# Patient Record
Sex: Female | Born: 1940 | Race: White | Hispanic: No | Marital: Married | Smoking: Never smoker
Health system: Southern US, Community
[De-identification: ages and names within clinical notes are randomized; demographics above are authoritative.]

## PROBLEM LIST (undated history)

## (undated) DIAGNOSIS — I10 Essential (primary) hypertension: Secondary | ICD-10-CM

## (undated) DIAGNOSIS — E079 Disorder of thyroid, unspecified: Secondary | ICD-10-CM

## (undated) DIAGNOSIS — C801 Malignant (primary) neoplasm, unspecified: Secondary | ICD-10-CM

## (undated) DIAGNOSIS — J45909 Unspecified asthma, uncomplicated: Secondary | ICD-10-CM

## (undated) HISTORY — PX: CHOLECYSTECTOMY: SHX55

## (undated) HISTORY — PX: ABDOMINAL SURGERY: SHX537

## (undated) HISTORY — PX: ABDOMINAL HYSTERECTOMY: SHX81

---

## 2005-08-17 ENCOUNTER — Ambulatory Visit: Payer: Self-pay | Admitting: Internal Medicine

## 2007-04-17 ENCOUNTER — Ambulatory Visit: Payer: Self-pay | Admitting: Internal Medicine

## 2008-04-20 ENCOUNTER — Ambulatory Visit: Payer: Self-pay | Admitting: Internal Medicine

## 2009-08-04 ENCOUNTER — Ambulatory Visit: Payer: Self-pay | Admitting: Gastroenterology

## 2009-08-10 ENCOUNTER — Ambulatory Visit: Payer: Self-pay | Admitting: Gynecologic Oncology

## 2009-08-13 ENCOUNTER — Ambulatory Visit: Payer: Self-pay | Admitting: Unknown Physician Specialty

## 2009-09-07 ENCOUNTER — Ambulatory Visit: Payer: Self-pay | Admitting: Gynecologic Oncology

## 2010-03-29 ENCOUNTER — Ambulatory Visit: Payer: Self-pay | Admitting: Internal Medicine

## 2010-12-26 IMAGING — CT CT ABD-PELV W/ CM
1 of 2 series · 15 of 32 positions shown, 19 images · non-contrast
Comparison: none

REASON FOR EXAM: abdominal pain, LUQ RLQ chg bowel habits, dysphagia
vomiting
COMMENTS:

[Series 2: abdomen · axial · 0.67mm/px · z∈[+206,+576]mm · 15 of 82 slices shown, 19 images]
[im 4/82  soft-tissue]
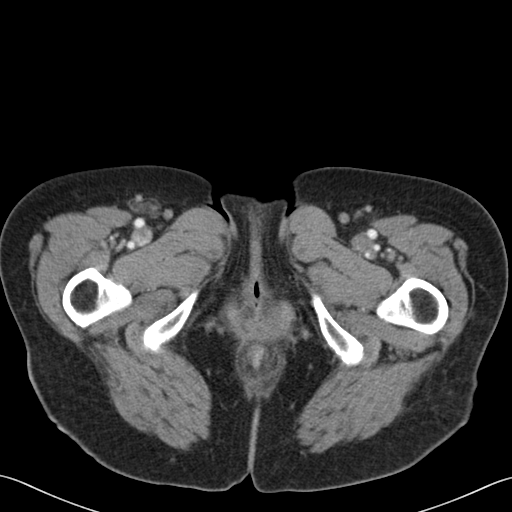
[im 4/82  bone]
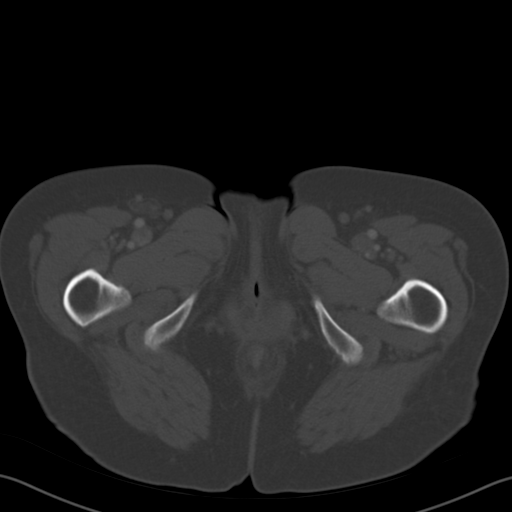
[im 10/82  soft-tissue]
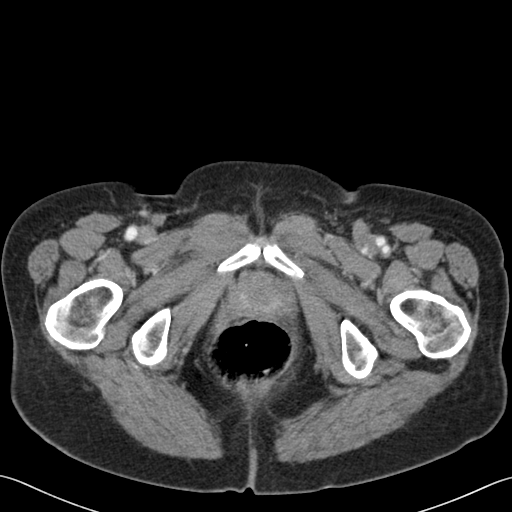
[im 17/82  soft-tissue]
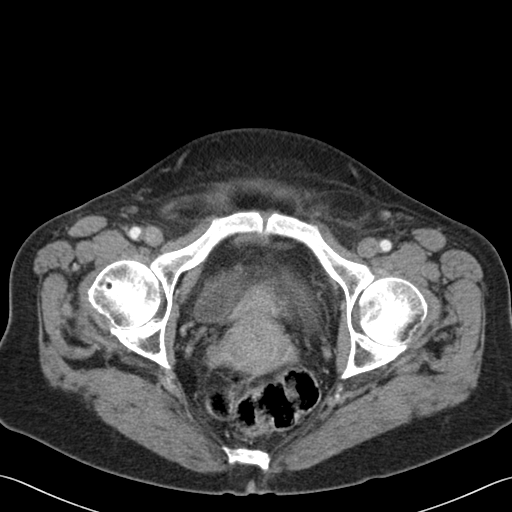
[im 23/82  soft-tissue]
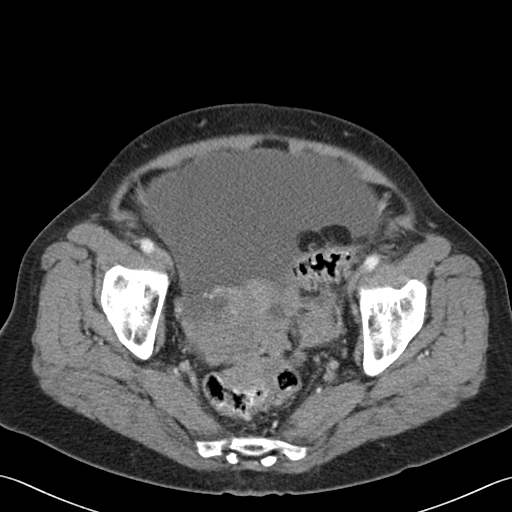
[im 30/82  soft-tissue]
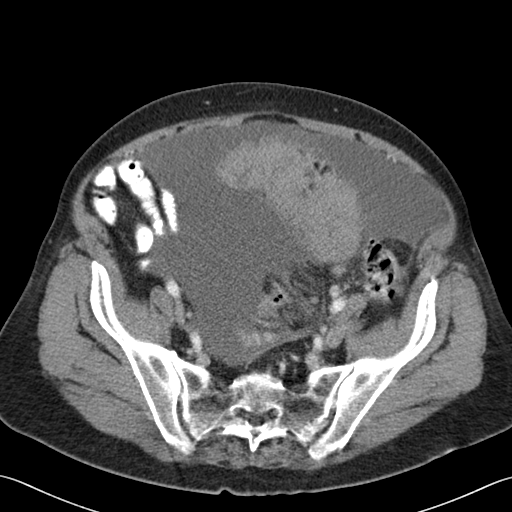
[im 36/82  soft-tissue]
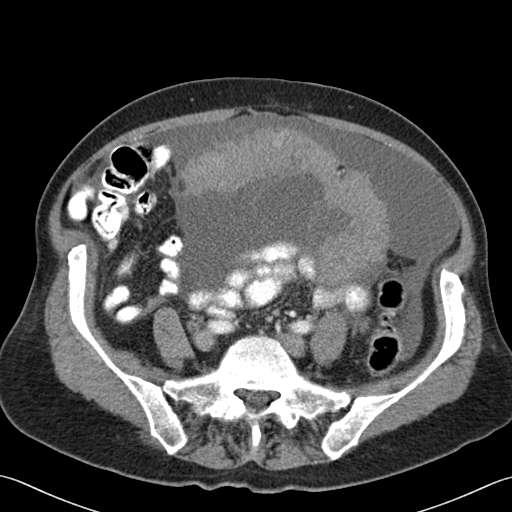
[im 43/82  soft-tissue]
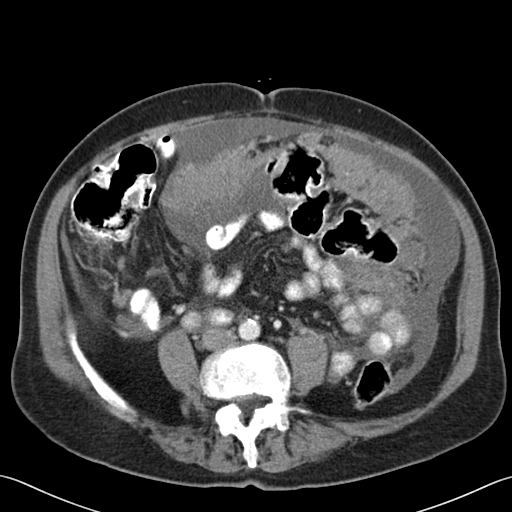
[im 46/82  soft-tissue]
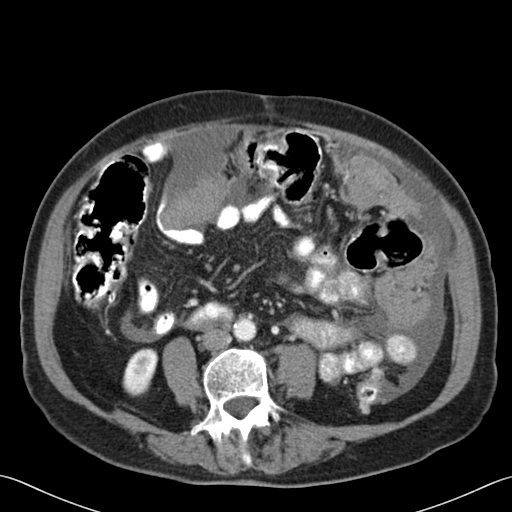
[im 52/82  soft-tissue]
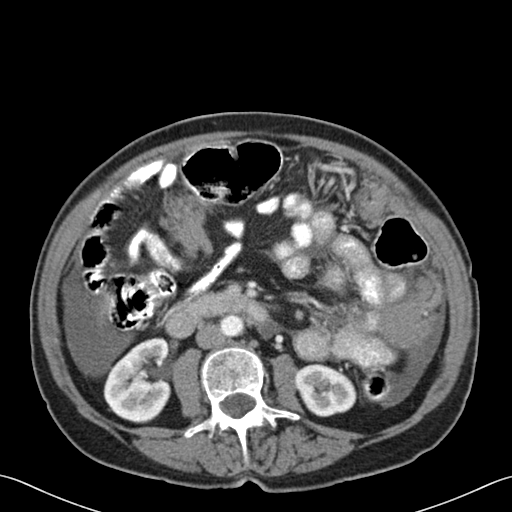
[im 52/82  bone]
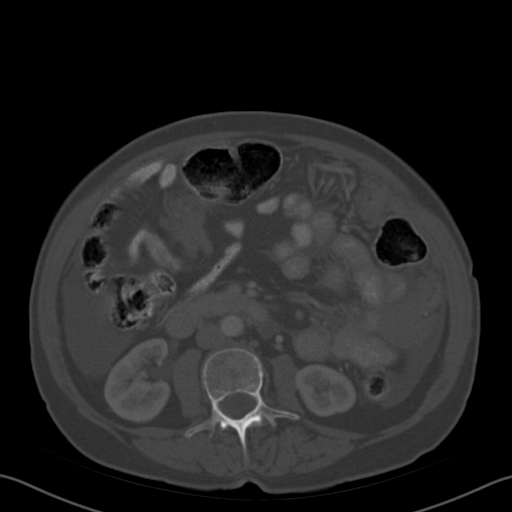
[im 59/82  soft-tissue]
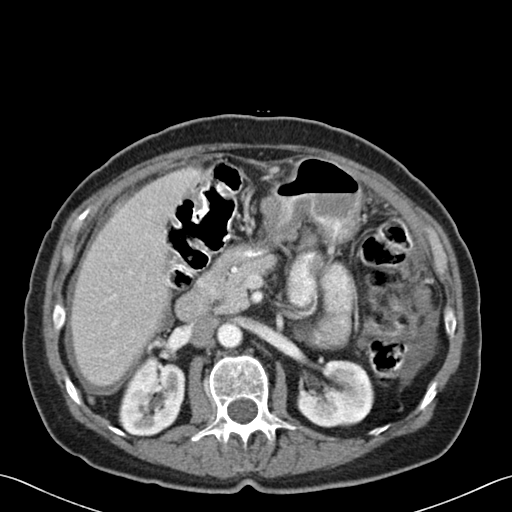
[im 65/82  soft-tissue]
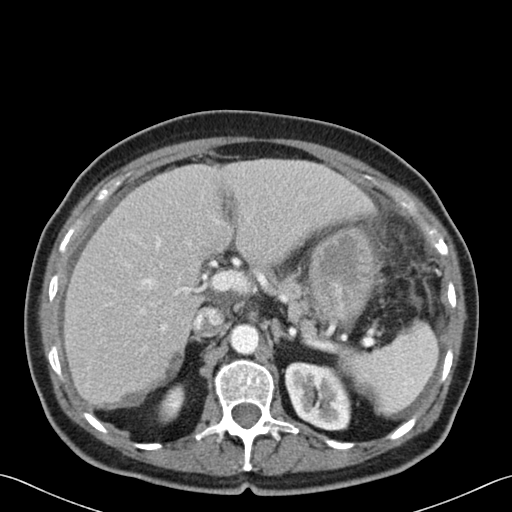
[im 69/82  lung]
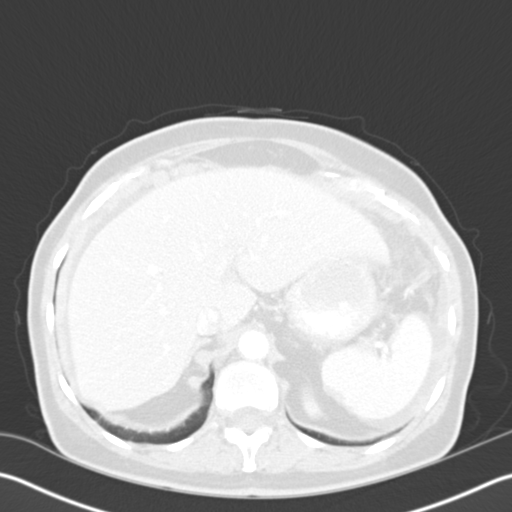
[im 72/82  soft-tissue]
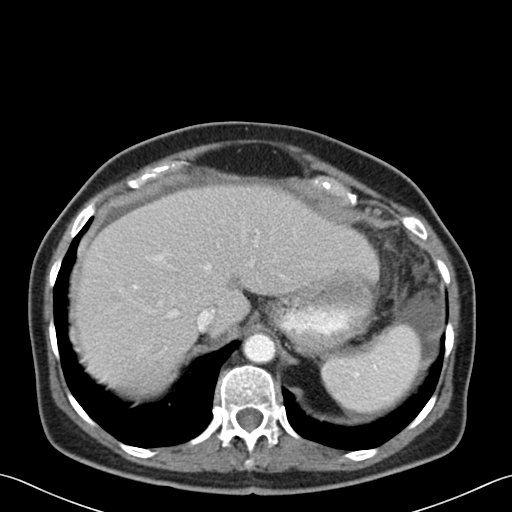
[im 72/82  lung]
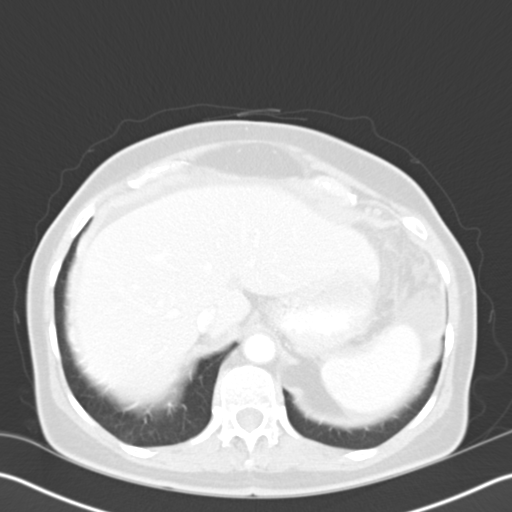
[im 75/82  lung]
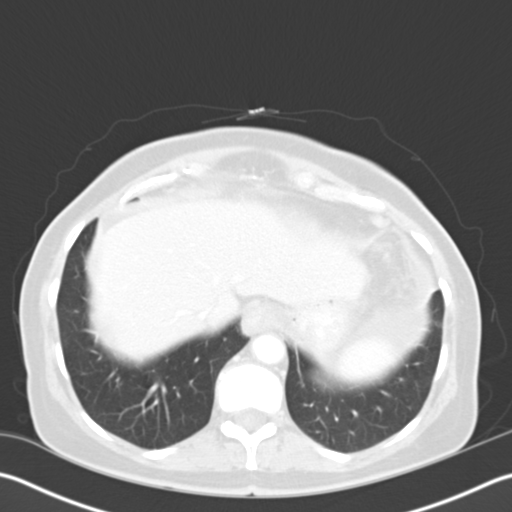
[im 78/82  soft-tissue]
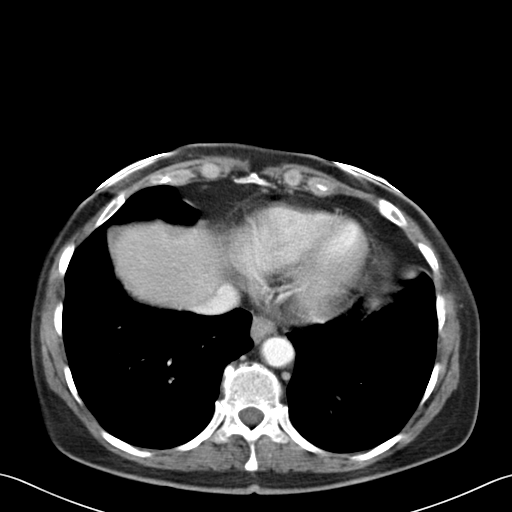
[im 78/82  lung]
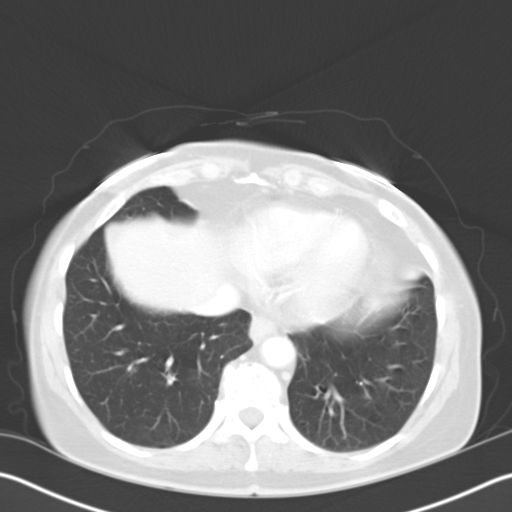

[15 of 32 positions shown; findings below may reference images not displayed]

PROCEDURE:     CT  - CT ABDOMEN / PELVIS  W  - August 04, 2009  [DATE]

RESULT:     CT of the abdomen and pelvis is performed utilizing 100 ml of
Esovue-P9U iodinated intravenous contrast along with oral contrast. Images
are reconstructed in the axial plane at 5 mm slice thickness. The patient
has no previous exam for comparison.

The study demonstrates extensive abnormality in the omentum and mesentery
with some heterogeneous nodularity in the left adnexal region measuring
x 3.90 cm. Another heterogeneous area is seen in the right adnexal region
measuring 5.26 x 5.34 cm on image #59. There is ascites present. There is no
pleural effusion. The lower portion of the lungs shows no focal pulmonary
mass. The liver and spleen enhance homogeneously. There is thickening of the
sigmoid colon which is nonspecific. This extends into the distal descending
colon. Correlate for possible colitis. There is no evidence of bowel
obstruction or abnormal bowel distention. Cholecystectomy clips are present.
The adrenal glands are normal. The pancreas appears unremarkable. No
retroperitoneal adenopathy is evident. The kidneys enhance symmetrically
without obstruction. No solid masses are present. There is some
low-attenuation along the posteromedial aspect of the right lobe of the
liver in the region of image #18 and image #19 which could represent
implants along the surface. Some areas of mesenteric nodularity are seen and
could represent enlarged lymph nodes or implants.

The urinary bladder appears nondistended.
IMPRESSION: 1.     Findings suspicious for underlying ovarian malignancy with extensive
omental caking and some mesenteric implants possibly with some hepatic
implants. There is an abnormal appearance of the distal descending and
sigmoid colon with wall thickening diffusely. Gynecologic Oncology
consultation is suggested.

## 2011-02-09 ENCOUNTER — Ambulatory Visit: Payer: Self-pay | Admitting: Rheumatology

## 2011-08-25 ENCOUNTER — Ambulatory Visit: Payer: Self-pay | Admitting: Internal Medicine

## 2011-12-18 ENCOUNTER — Ambulatory Visit: Payer: Self-pay | Admitting: General Practice

## 2011-12-18 LAB — URINALYSIS, COMPLETE
Bacteria: NONE SEEN
Ketone: NEGATIVE
Leukocyte Esterase: NEGATIVE
Nitrite: NEGATIVE
Ph: 7 (ref 4.5–8.0)
Protein: NEGATIVE
RBC,UR: NONE SEEN /HPF (ref 0–5)
Specific Gravity: 1.004 (ref 1.003–1.030)
WBC UR: 18 /HPF (ref 0–5)

## 2011-12-18 LAB — PROTIME-INR: INR: 0.9

## 2011-12-18 LAB — CBC
HGB: 13 g/dL (ref 12.0–16.0)
MCHC: 32.5 g/dL (ref 32.0–36.0)
MCV: 93 fL (ref 80–100)
RBC: 4.31 10*6/uL (ref 3.80–5.20)
RDW: 14.5 % (ref 11.5–14.5)

## 2011-12-18 LAB — BASIC METABOLIC PANEL
BUN: 12 mg/dL (ref 7–18)
Calcium, Total: 10.2 mg/dL — ABNORMAL HIGH (ref 8.5–10.1)
Chloride: 105 mmol/L (ref 98–107)
Co2: 27 mmol/L (ref 21–32)
Creatinine: 0.62 mg/dL (ref 0.60–1.30)
EGFR (African American): >60
EGFR (Non-African Amer.): >60
Glucose: 96 mg/dL (ref 65–99)
Potassium: 4 mmol/L (ref 3.5–5.1)
Sodium: 140 mmol/L (ref 136–145)

## 2011-12-18 LAB — MRSA PCR SCREENING

## 2011-12-18 LAB — SEDIMENTATION RATE: Erythrocyte Sed Rate: 17 mm/hr (ref 0–30)

## 2011-12-19 LAB — URINE CULTURE

## 2012-01-03 ENCOUNTER — Inpatient Hospital Stay: Payer: Self-pay | Admitting: General Practice

## 2012-01-04 LAB — BASIC METABOLIC PANEL
Anion Gap: 8 (ref 7–16)
BUN: 11 mg/dL (ref 7–18)
Calcium, Total: 9.4 mg/dL (ref 8.5–10.1)
EGFR (African American): >60
EGFR (Non-African Amer.): >60

## 2012-01-04 LAB — HEMOGLOBIN: HGB: 11.1 g/dL — ABNORMAL LOW (ref 12.0–16.0)

## 2012-01-05 LAB — BASIC METABOLIC PANEL
Anion Gap: 5 — ABNORMAL LOW (ref 7–16)
Chloride: 108 mmol/L — ABNORMAL HIGH (ref 98–107)
EGFR (Non-African Amer.): >60
Glucose: 96 mg/dL (ref 65–99)
Osmolality: 281 (ref 275–301)
Potassium: 4.1 mmol/L (ref 3.5–5.1)

## 2012-01-05 LAB — HEMOGLOBIN: HGB: 9.9 g/dL — ABNORMAL LOW (ref 12.0–16.0)

## 2012-02-08 DIAGNOSIS — C569 Malignant neoplasm of unspecified ovary: Secondary | ICD-10-CM | POA: Insufficient documentation

## 2012-04-09 ENCOUNTER — Ambulatory Visit: Payer: Self-pay | Admitting: General Practice

## 2012-04-09 LAB — BASIC METABOLIC PANEL
Anion Gap: 5 — ABNORMAL LOW (ref 7–16)
Calcium, Total: 10.4 mg/dL — ABNORMAL HIGH (ref 8.5–10.1)
Chloride: 103 mmol/L (ref 98–107)
EGFR (Non-African Amer.): >60
Glucose: 98 mg/dL (ref 65–99)
Osmolality: 277 (ref 275–301)
Potassium: 4 mmol/L (ref 3.5–5.1)
Sodium: 139 mmol/L (ref 136–145)

## 2012-04-09 LAB — URINALYSIS, COMPLETE
Bilirubin,UR: NEGATIVE
Blood: NEGATIVE
Ketone: NEGATIVE
Leukocyte Esterase: NEGATIVE
Nitrite: NEGATIVE
Ph: 7 (ref 4.5–8.0)
Protein: NEGATIVE
RBC,UR: <1 /HPF (ref 0–5)
Squamous Epithelial: NONE SEEN
Transitional Epi: <1

## 2012-04-09 LAB — SEDIMENTATION RATE: Erythrocyte Sed Rate: 5 mm/hr (ref 0–30)

## 2012-04-09 LAB — CBC
HCT: 40.7 % (ref 35.0–47.0)
HGB: 13.7 g/dL (ref 12.0–16.0)
MCHC: 33.6 g/dL (ref 32.0–36.0)
Platelet: 396 10*3/uL (ref 150–440)
RBC: 4.5 10*6/uL (ref 3.80–5.20)
RDW: 14.1 % (ref 11.5–14.5)
WBC: 9.8 10*3/uL (ref 3.6–11.0)

## 2012-04-09 LAB — APTT: Activated PTT: 29.2 secs (ref 23.6–35.9)

## 2012-04-22 ENCOUNTER — Inpatient Hospital Stay: Payer: Self-pay | Admitting: General Practice

## 2012-04-23 LAB — BASIC METABOLIC PANEL
Anion Gap: 8 (ref 7–16)
BUN: 10 mg/dL (ref 7–18)
Calcium, Total: 8.9 mg/dL (ref 8.5–10.1)
Chloride: 107 mmol/L (ref 98–107)
Creatinine: 0.85 mg/dL (ref 0.60–1.30)
EGFR (African American): >60

## 2012-04-23 LAB — PLATELET COUNT: Platelet: 306 10*3/uL (ref 150–440)

## 2012-04-24 LAB — BASIC METABOLIC PANEL
Anion Gap: 6 — ABNORMAL LOW (ref 7–16)
BUN: 8 mg/dL (ref 7–18)
Calcium, Total: 9 mg/dL (ref 8.5–10.1)
Chloride: 106 mmol/L (ref 98–107)
Co2: 27 mmol/L (ref 21–32)
EGFR (Non-African Amer.): >60
Glucose: 95 mg/dL (ref 65–99)
Potassium: 3.8 mmol/L (ref 3.5–5.1)
Sodium: 139 mmol/L (ref 136–145)

## 2012-04-24 LAB — HEMOGLOBIN: HGB: 11.1 g/dL — ABNORMAL LOW (ref 12.0–16.0)

## 2012-05-16 DIAGNOSIS — Z9889 Other specified postprocedural states: Secondary | ICD-10-CM | POA: Insufficient documentation

## 2012-05-16 DIAGNOSIS — E039 Hypothyroidism, unspecified: Secondary | ICD-10-CM | POA: Insufficient documentation

## 2012-05-16 DIAGNOSIS — I10 Essential (primary) hypertension: Secondary | ICD-10-CM | POA: Insufficient documentation

## 2012-07-02 IMAGING — CR DG KNEE STANDING AP BILAT
1 series · 1 of 1 positions shown · non-contrast
Comparison: none

REASON FOR EXAM: pain
COMMENTS:

[view not recorded]
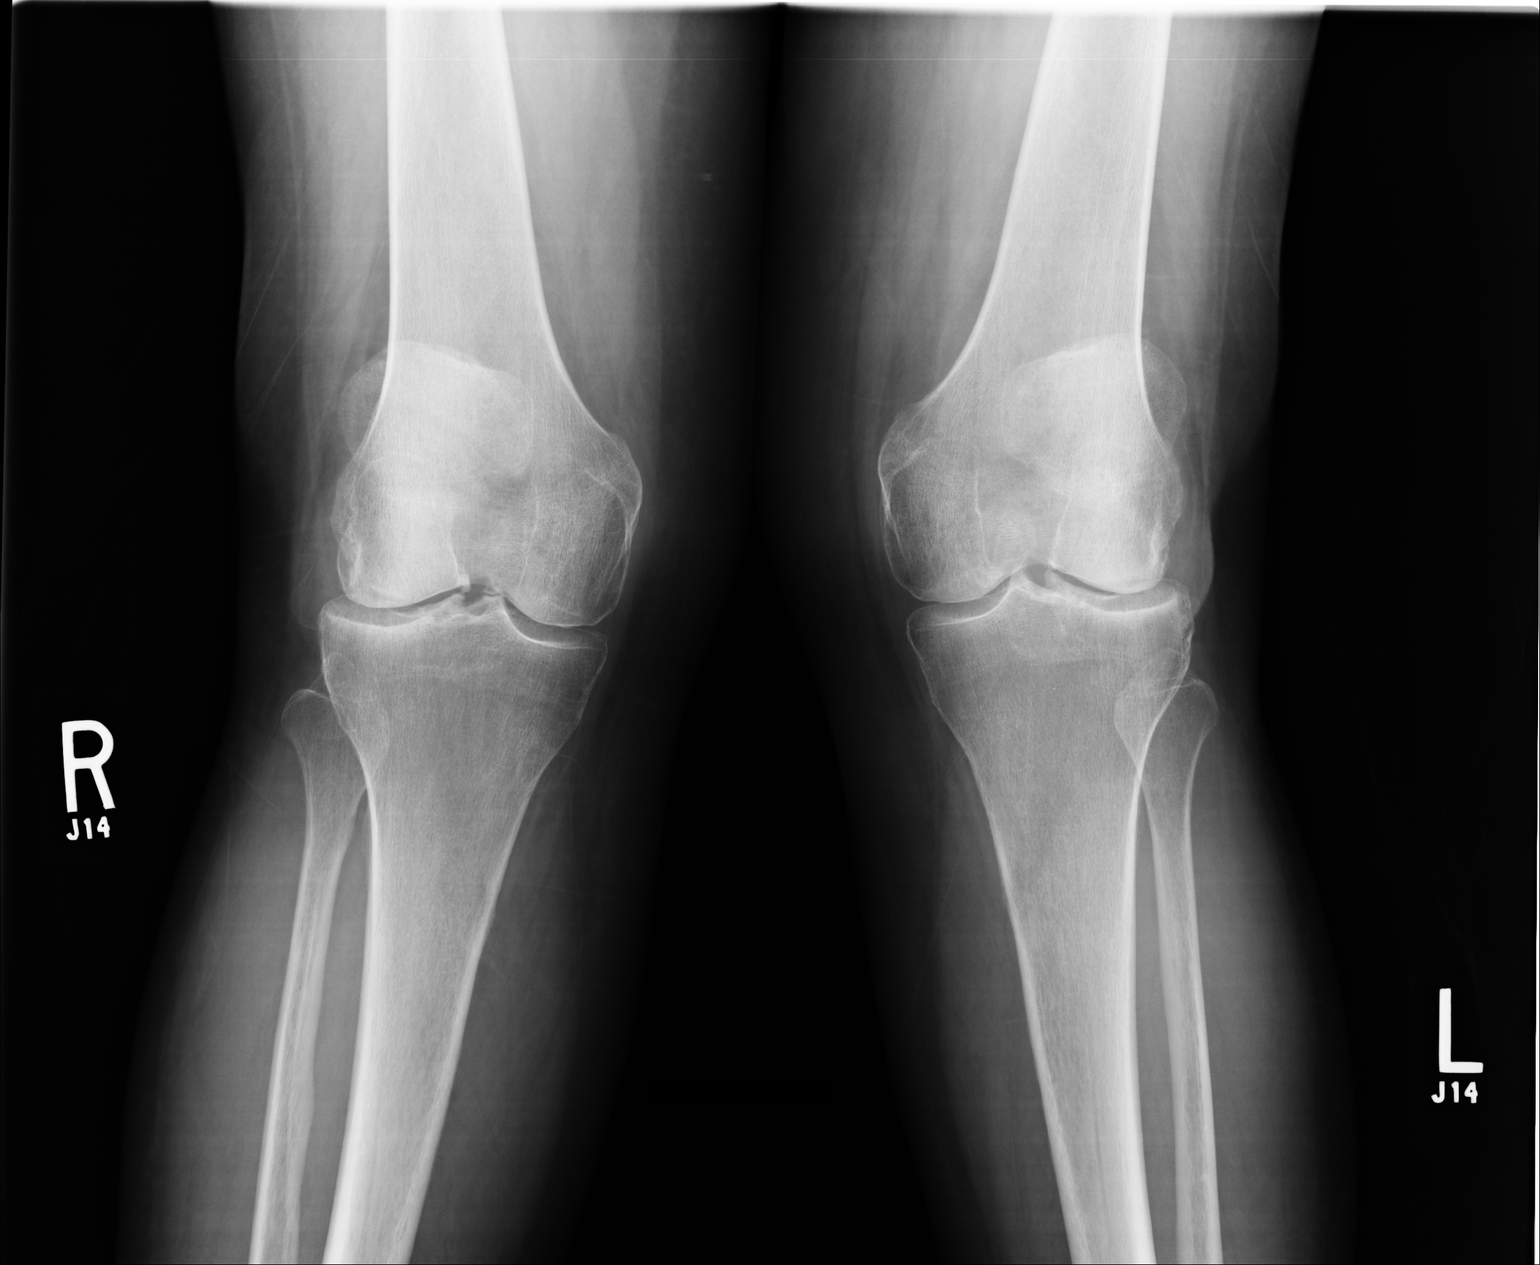

[1 of 1 positions shown; findings below may reference images not displayed]

PROCEDURE:     DXR - DXR BILATERAL KNEES STANDING  - February 09, 2011  [DATE]

RESULT:     Standing AP views of both knees were performed. No fracture or
dislocation is seen. No definite spur formation about the knee is seen on
either side. The knee joint spaces are symmetrical. There are noted clefts
in both tibial spines on the right. The possibility of cruciate ligament
injuries cannot be excluded. The tibial spines on the left are normal in
appearance.
IMPRESSION: There are noted clefts in both tibial spines on the right.
The possibility of cruciate ligament injuries cannot be excluded. This can
be further evaluated with MR, if clinically indicated.

## 2012-08-26 ENCOUNTER — Ambulatory Visit: Payer: Self-pay | Admitting: Internal Medicine

## 2012-09-19 DIAGNOSIS — R519 Headache, unspecified: Secondary | ICD-10-CM | POA: Insufficient documentation

## 2013-10-30 ENCOUNTER — Ambulatory Visit: Payer: Self-pay | Admitting: Internal Medicine

## 2014-05-20 ENCOUNTER — Ambulatory Visit: Payer: Self-pay | Admitting: Podiatry

## 2014-05-25 ENCOUNTER — Encounter: Payer: Self-pay | Admitting: Podiatry

## 2014-05-25 ENCOUNTER — Ambulatory Visit (INDEPENDENT_AMBULATORY_CARE_PROVIDER_SITE_OTHER): Payer: Medicare HMO | Admitting: Podiatry

## 2014-05-25 VITALS — BP 155/75 | HR 76 | Resp 16 | Ht 63.0 in | Wt 158.0 lb

## 2014-05-25 DIAGNOSIS — M79606 Pain in leg, unspecified: Secondary | ICD-10-CM

## 2014-05-25 DIAGNOSIS — B351 Tinea unguium: Secondary | ICD-10-CM

## 2014-05-25 NOTE — Progress Notes (Signed)
   Subjective:    Patient ID: Kendra Park, female    DOB: 12-09-1940, 73 y.o.   MRN: [id number]  HPI Comments: I need my toenails trimmed. The only time they hurt is when they grow out. My 2nd toenail on my rt foot is thick. This has been going on for 1 year. Its getting worse. i have pedicures.      Review of Systems  All other systems reviewed and are negative.      Objective:   Physical Exam: I have reviewed her past medical history medications allergies surgery social history and review of systems. Pulses are strongly palpable bilateral.neurologic sensorium is intact per Sims was C monofilament. Deep tendon reflexes intact bilateral muscle strength is 5 over 5 dorsiflexion plantar flexors and inverters everters onto the musculatures intact. Orthopedic evaluation to the straight all joints distal to the ankle for range of motion without crepitation. She has severe hammertoe deformitiesHAVdeformities bilateral. Cutaneous evaluation demonstrates yellow dystrophic with mycotic nails which are painful palpation as well as debridement bilateral.        Assessment & Plan:  Assessment: Severe digital deformities bilateral. Pain in limb secondary to onychomycosis 1 through 5 bilateral.  Plan: Debridement of nails 1 through 5 bilateral covered service a new pain.

## 2014-07-29 ENCOUNTER — Ambulatory Visit (INDEPENDENT_AMBULATORY_CARE_PROVIDER_SITE_OTHER): Payer: PPO | Admitting: Podiatry

## 2014-07-29 DIAGNOSIS — M79606 Pain in leg, unspecified: Secondary | ICD-10-CM

## 2014-07-29 DIAGNOSIS — B351 Tinea unguium: Secondary | ICD-10-CM

## 2014-07-29 NOTE — Progress Notes (Signed)
She presents today with a chief complaint of painful elongated toenails.  Objective: Her nails are thick yellow dystrophic with mycotic and painful palpation.  Assessment: Pain limb secondary onychomycosis 1 through 5 bilateral.  Plan: Treatment of nails 1 through 5 bilateral.

## 2014-09-28 ENCOUNTER — Ambulatory Visit (INDEPENDENT_AMBULATORY_CARE_PROVIDER_SITE_OTHER): Payer: PPO | Admitting: Podiatry

## 2014-09-28 DIAGNOSIS — B351 Tinea unguium: Secondary | ICD-10-CM | POA: Diagnosis not present

## 2014-09-28 DIAGNOSIS — M79606 Pain in leg, unspecified: Secondary | ICD-10-CM

## 2014-09-28 NOTE — Progress Notes (Signed)
She presents today with a chief complaint of painful elongated toenails.  Objective: Her nails are thick yellow dystrophic with mycotic and painful palpation.  Assessment: Pain limb secondary onychomycosis 1 through 5 bilateral.  Plan: Treatment of nails 1 through 5 bilateral.

## 2014-10-27 NOTE — Discharge Summary (Signed)
PATIENT NAME:  Kendra Park, MARTORANO MR#:  [id number] DATE OF BIRTH:  Nov 12, 1940  DATE OF ADMISSION:  04/22/2012 DATE OF DISCHARGE:  04/25/2012  ADMITTING DIAGNOSIS: Degenerative arthrosis of the left knee.   DISCHARGE DIAGNOSIS: Degenerative arthrosis of the left knee.   HISTORY: The patient is a 74 year old who has been followed at [facility] for bilateral knee pain. She reported a several year history of knee pain. Initially the right knee was noted to be more symptomatic. Subsequently, she underwent a right total knee arthroplasty approximately three months ago and did very well and was pleased with the surgery. She continued to have discomfort on the left and subsequently decided to have the left knee done as well. She complained of pain along both the medial and lateral aspects of the knee. She did report some occasional near giving way. The patient had not seen any significant improvement in her symptoms despite activity modification, intraarticular cortisone injections as well as a series of Synvisc injections to the left knee. The patient states that the pain had progressed to the point that it was significantly interfering with her activities of daily living. X-rays taken in the clinic showed narrowing of the lateral compartment with increased valgus deformity. There was mild subchondral sclerosis as well as some very early osteophyte formation noted. After discussion of the risks and benefits of surgical intervention, the patient expressed her understanding of the risks and benefits and agreed with plans for surgical intervention.   PROCEDURE: Left total knee arthroplasty using computer-assisted navigation.   ANESTHESIA: Femoral nerve block with spinal.   SOFT TISSUE RELEASES: Anterior cruciate ligament, posterior cruciate ligament, deep medial collateral ligament, patellofemoral ligament, posterolateral corner, and pie-crusting of the iliotibial band.   IMPLANTS UTILIZED: DePuy PFC Sigma  size three posterior stabilized femoral component (cemented), size 2.5 MBT tibial component (cemented), 35-mm three pegged oval dome patella (cemented), and a 10-mm stabilized rotating platform polyethylene insert.   HOSPITAL COURSE: The patient tolerated the procedure very well. She had no complications. She was then taken to the PAC-U where she was stabilized and then transferred to the orthopedic floor. The patient began receiving anticoagulation therapy of Lovenox 30 mg subcutaneous every 12 hours per anesthesia and pharmacy protocol. She was fitted with TED stockings bilaterally. These were allowed to be removed one hour per eight-hour shift. The left one was applied on day two following removal of the Hemovac and dressing change. The patient was also fitted with the AV-I compression foot pumps bilaterally set at 80 mmHg. Her calves have been nontender. There has been no evidence of any deep venous thromboses. Heels were elevated off the bed using rolled towels.   The patient has denied any chest pain or any shortness of breath. Vital signs have been stable. She has been afebrile. Hemodynamically she was stable. No transfusions were given other than the Autovac transfusion given postoperatively. Laboratory studies were  in acceptable limits. .   Physical therapy was initiated on day one for activities of daily living and assistive devices. Upon being discharged she was ambulating greater than 200 feet. She was able to go up and down four sets of steps. She was independent with bed to chair transfers. Occupational therapy was also initiated on day one for activities of daily living and assistive devices.   The patient's IV, Foley, and Hemovac were discontinued on day two along with a dressing change. The wound was free of any drainage or signs of infection.  The Polar Care was  reapplied to the surgical leg maintaining a temperature of 40 to 50 degrees Fahrenheit.   DISPOSITION: The patient is being  discharged to home in improved stable condition.   DISCHARGE INSTRUCTIONS:  1. She may weight bear as tolerated.  2. Continue using the TED stockings during the day but may remove these at night.  3. She is to continue using a walker until cleared by physical therapy to go to a quad cane.  4. She will receive Home Health physical therapy.   5. Continue to use the Polar Care maintaining a temperature of 40 to 50 degrees Fahrenheit.  6. She is placed on a regular diet.  7. She is to follow up in the clinic in two weeks, sooner if any temperatures of 101.5 or greater or excessive bleeding.  8. She is to resume her regular medication that she was on prior to admission. She was given a prescription for Lovenox 40 mg subcutaneously daily for 14 days, then discontinue and begin taking one 81-mg enteric-coated aspirin. Also a prescription for oxycodone 1 to 2 tablets every 4 to 6 hours p.r.n. for pain. Tramadol 50 to 100 mg q. 4-6 hours p.r.n. for pain.   PAST MEDICAL HISTORY: 1. Seasonal allergies.  2. Hypothyroidism.  3. Ovarian cancer.  4. Hypertension.  5. Osteoarthritis.  6. Osteoporosis.    ____________________________ Vance Peper, PA [email or web address] D: 04/25/2012 07:42:42 ET T: 04/25/2012 14:51:51 ET JOB#: [id number]  cc: Vance Peper, PA, <Dictator> JON WOLFE PA ELECTRONICALLY SIGNED 04/28/2012 21:36

## 2014-10-27 NOTE — Op Note (Signed)
PATIENT NAME:  Kendra Park, Kendra Park MR#:  [id number] DATE OF BIRTH:  1940/10/19  DATE OF PROCEDURE:  04/22/2012  PREOPERATIVE DIAGNOSIS: Degenerative arthrosis of the left knee.   POSTOPERATIVE DIAGNOSIS: Degenerative arthrosis of the left knee.   PROCEDURE PERFORMED: Left total knee arthroplasty using computer-assisted navigation.   SURGEON: Laurice Record. Holley Bouche., MD    ASSISTANT: Vance Peper, PA-C (required to maintain retraction throughout the procedure)   ANESTHESIA: Femoral nerve block and spinal.   ESTIMATED BLOOD LOSS: 50 mL.   FLUIDS REPLACED: 1700 mL of Crystalloid.   TOURNIQUET TIME: 97 minutes.   DRAINS: Two medium drains to reinfusion system.   SOFT TISSUE RELEASES: Anterior cruciate ligament, posterior cruciate ligament, deep medial collateral ligament, patellofemoral ligament, posterolateral corner, and pie-crusting of the iliotibial band.   IMPLANTS UTILIZED: DePuy PFC Sigma size 3 posterior stabilized femoral component (cemented), size 2.5 MBT tibial component (cemented), 35 mm three peg oval dome patella (cemented), and a 10 mm stabilized rotating platform polyethylene insert.   INDICATIONS FOR SURGERY: The patient is a 74 year old female who has been seen for complaints of progressive left knee pain and valgus deformity. X-rays of the left knee demonstrated severe degenerative changes with valgus deformity. She had previously undergone successful right total knee arthroplasty with marked improvement of her right knee symptoms. Risks and benefits of surgical intervention were discussed with the patient. She expressed her understanding of the risks and benefits and agreed with plans for surgical intervention.   PROCEDURE IN DETAIL: The patient was brought in the operating room and, after adequate femoral nerve block and spinal anesthesia was achieved, a tourniquet was placed on the patient's upper left thigh. The patient's left knee and leg were cleaned and prepped with alcohol  and DuraPrep and draped in the usual sterile fashion. A "time-out" was performed as per usual protocol. The left lower extremity was exsanguinated using an Esmarch and the tourniquet was inflated to 300 mmHg. Anterior longitudinal incision was made followed by a standard mid vastus approach. A moderate effusion was evacuated. The deep fibers of the medial collateral ligament were elevated in a subperiosteal fashion off the medial flare of the tibia so as to maintain a continuous soft tissue sleeve. Patella was subluxed laterally and patellofemoral ligament was incised. Inspection of the knee demonstrated severe degenerative changes most notably to the lateral compartment. There was evidence of full thickness loss of articular cartilage to the lateral compartment. Prominent osteophytes were debrided using a rongeur. Anterior and posterior cruciate ligaments were excised. Two 4.0 mm Schanz pins were inserted in the femur and in the tibia for attachment of the array of spheres used for computer-assisted navigation. Hip center was identified using circumduction technique. Distal landmarks were mapped using the computer. Distal femur and proximal tibia were mapped using the computer. Distal femoral cutting guide was positioned using computer-assisted navigation so as to achieve a 5 degree distal valgus cut. Cut was performed and verified using the computer. Distal femur was sized and it was felt that a size 3 femoral component was appropriate. Size 3 cutting guide was positioned and the anterior cut was performed and verified using the computer. This was followed by completion of the posterior and chamfer cuts. Femoral cutting guide for the central box was then positioned and the central box cut was performed.   Attention was then directed to the proximal tibia. Medial and lateral menisci were excised. The extramedullary tibial cutting guide was positioned using computer-assisted navigation so as to achieve 0  degree  varus valgus alignment and 0 degree posterior slope. Cut was performed and verified using the computer. Proximal tibia was sized and it was felt that a size 2.5 tibial tray was appropriate. Tibial and femoral trials were inserted followed by insertion of a 10 mm polyethylene insert. Knee was felt to be tight laterally. Trial components were removed. The knee was placed in extension and then Moreland retractors were used to distract the joint. The posterolateral corner was noted be extremely tight. Using a combination of electrocautery and Metzenbaum scissors, the posterolateral corner was carefully released. This allowed for improvement with mediolateral soft tissue balancing. Trial components were reinserted followed by insertion of a 10 mm polyethylene insert. The IT band was still felt to be extremely tight and pie-crusting of the iliotibial band was performed. This allowed for excellent mediolateral soft tissue balancing both in full extension and in flexion. Finally, patella was cut and prepared so as to accommodate a 35 mm three peg oval dome patella. Patellar trial was placed and the knee was placed through a range of motion with excellent patellar tracking appreciated.   Polyethylene was removed. Posterior osteophytes were debrided from the femur using a combination of osteotomes and rongeurs. Femoral component was removed. Drill holes were made in areas of eburnated bone along the lateral femoral condyle. Central post hole for the tibial component was reamed followed by insertion of a keel punch. Tibial tray was then removed. Cut surfaces of bone were irrigated with copious amounts of normal saline with antibiotic solution using pulsatile lavage and then suctioned dry. Polymethyl methacrylate cement with gentamicin was prepared in the usual fashion using a vacuum mixer. Cement was applied to the cut surface of the tibia as well as along the undersurface of a size 2.5 MBT tibial component. Tibial component  was positioned and impacted into place. Excess cement was removed using freer elevators. Cement was then applied to the cut surface of the distal femur as well as along the posterior flanges of size 3 posterior stabilized femoral component. Femoral component was positioned and impacted into place. Excess cement was removed using freer elevators. A 10 mm polyethylene trial was inserted and the knee was brought in full extension with steady axial compression applied. Finally, cement was applied to the backside of a 35 mm three peg oval dome patella and patellar component was positioned and patellar clamp applied. Excess cement was removed using freer elevators.   After adequate curing of cement, the tourniquet was deflated after a total tourniquet time of 97 minutes. Hemostasis was achieved using electrocautery. The knee was irrigated with copious amounts of normal saline with antibiotic solution using pulsatile lavage and then suctioned dry. The knee was inspected for any residual cement debris. 30 mL of 0.25% Marcaine with epinephrine was injected along the posterior capsule. A 10 mm stabilized rotating platform polyethylene insert was inserted and the knee was placed through a range of motion. Excellent patellar tracking was appreciated. Excellent mediolateral soft tissue balancing was appreciated both in full extension and in flexion. Two medium drains were placed in the wound bed and brought out through a separate stab incision to be attached to a reinfusion system. The medial parapatellar portion of the incision was reapproximated using interrupted sutures of #1 Vicryl. Subcutaneous tissue was approximated in layers using first #0 Vicryl followed by #2-0 Vicryl. Skin was closed with skin staples. Sterile dressing was applied.   The patient tolerated the procedure well. She was transported to the recovery room  in stable condition.    ____________________________ Laurice Record. Holley Bouche.,  MD [id number] D: 04/22/2012 19:22:53 ET T: 04/23/2012 07:52:04 ET JOB#: [id number] cc: Laurice Record. Holley Bouche., MD, <Dictator> JAMES P Holley Bouche MD ELECTRONICALLY SIGNED 04/26/2012 7:00

## 2014-11-01 NOTE — Op Note (Signed)
PATIENT NAME:  Kendra Park, Kendra Park MR#:  [id number] DATE OF BIRTH:  [birth year]  DATE OF PROCEDURE:  01/03/2012  PREOPERATIVE DIAGNOSIS: Degenerative arthrosis of the right knee.   POSTOPERATIVE DIAGNOSIS: Degenerative arthrosis of the right knee.   PROCEDURE PERFORMED: Right total knee arthroplasty using computer-assisted navigation.   SURGEON: Laurice Record. Holley Bouche., MD    ASSISTANT: Vance Peper, PA-C (required to maintain retraction throughout the procedure)   ANESTHESIA: Femoral nerve block and spinal.   ESTIMATED BLOOD LOSS: 50 mL.   FLUIDS REPLACED: 1400 mL of Crystalloid.   TOURNIQUET TIME: 91 minutes.   DRAINS: Two medium drains to reinfusion system.   SOFT TISSUE RELEASES: Anterior cruciate ligament, posterior cruciate ligament, deep medial collateral ligament, patellofemoral ligament, and posterolateral corner.   IMPLANTS UTILIZED: DePuy PFC Sigma size 3 posterior stabilized femoral component (cemented), size 2.5 MBT tibial component (cemented), 35 mm three peg oval dome patella (cemented), and a 10 mm stabilized rotating platform polyethylene insert.   INDICATIONS FOR SURGERY: The patient is a 74 year old female who has been seen for complaints of progressive right knee pain. X-rays demonstrated severe degenerative changes in tricompartmental fashion with significant valgus deformity. After discussion of the risks and benefits of surgical intervention, the patient expressed her understanding of the risks and benefits and agreed with plans for surgical intervention.   PROCEDURE IN DETAIL: The patient was brought in the operating room and, after adequate femoral nerve block and spinal anesthesia was achieved, a tourniquet was placed on the patient's upper right thigh. The patient's right knee and leg were cleaned and prepped with alcohol and DuraPrep and draped in the usual sterile fashion. A "time-out" was performed as per usual protocol. The right lower extremity was exsanguinated  using an Esmarch, and the tourniquet was inflated to 300 mmHg. Anterior longitudinal incision was made followed by a standard mid vastus approach. A large effusion was evacuated. The deep fibers of the medial collateral ligament were elevated in a subperiosteal fashion off the medial flare of the tibia so as to maintain a continuous soft tissue sleeve. The patella was subluxed laterally and the patellofemoral ligament was incised. Inspection of the knee demonstrated severe degenerative changes in tricompartmental fashion with evidence of eburnated bone to the lateral compartment. Prominent osteophytes were debrided using a rongeur. Anterior and posterior cruciate ligaments were excised. Two 4.0 mm Schanz pins were inserted into the femur and into the tibia for attachment of the array of spheres used for computer-assisted navigation. Hip center was identified using circumduction technique. Distal landmarks were mapped using the computer. Distal femur and proximal tibia were mapped using the computer. Distal femoral cutting guide was positioned using computer-assisted navigation so as to achieve a 5 degree distal valgus cut. Cut was performed and verified using the computer. Distal femur was sized and it was felt that a size 3 femoral component was appropriate. Size 3 cutting guide was positioned and the anterior cut was performed and verified using the computer. This was followed by completion of the posterior and chamfer cuts. Femoral cutting guide for the central box was then position and central box cut was performed.   Attention was then directed to the proximal tibia. Medial and lateral menisci were excised. The extramedullary tibial cutting guide was positioned using computer-assisted navigation so as to achieve a 0 degree varus valgus alignment and 0 degree posterior slope. Cut was performed and verified using the computer. Proximal tibia was sized and it was felt that a size 2.5 tibial tray  was appropriate.  It should be noted that the cancellous bone to the proximal tibia was somewhat osteoporotic in appearance. The tibial and femoral trials were inserted followed by insertion of a 10 mm polyethylene insert. The knee was felt to be tight laterally. Trial components were removed and knee was placed in extension with Moreland retractors used to distract the joint. The posterolateral corner was noted to be extremely tight and this was carefully released using a combination of electrocautery and Metzenbaum scissors. Good medial and lateral soft tissue balancing was thus achieved. Trial components were reinserted followed by insertion of a 10 mm polyethylene trial. Excellent mediolateral soft tissue balancing was noted both in extension and in 90 degrees of flexion. Finally, patella was cut and prepared so as to accommodate a 35 mm three peg oval dome patella. Patellar trial was placed and the knee was placed through a range of motion with excellent patellar tracking appreciated.   Femoral trial was removed. Central post hole for the tibial component was reamed followed by insertion of a keel punch. Tibial trial was then removed. Cut surfaces of bone were irrigated with copious amounts of normal saline with antibiotic solution using pulsatile lavage and then suctioned dry. Polymethyl methacrylate cement was prepared in the usual fashion using a vacuum mixer. Cement was applied to the cut surface of the proximal tibia as well as along the undersurface of a size 2.5 MBT tibial component. Tibial component was positioned and impacted into place. Excess cement was removed using freer elevators. Cement was then applied to the cut surfaces of the femur as well as along the posterior flanges of a size 3 posterior stabilized femoral component. Femoral component was positioned and impacted into place. Excess cement was removed using freer elevators. A 10 mm polyethylene trial was inserted and the knee was brought in full extension  with steady axial compression applied. Finally, cement was applied to the backside of a 35 mm three peg oval dome patella and the patellar component was positioned and patellar clamp applied. Excess cement was removed using freer elevators.   After adequate curing of cement, the tourniquet was deflated after a total tourniquet time of 91 minutes. Hemostasis was achieved using electrocautery. The knee was irrigated with copious amounts of normal saline with antibiotic solution using pulsatile lavage and then suctioned dry. The knee was inspected for any residual cement debris. 30 mL of 0.25% Marcaine with epinephrine was injected along the posterior capsule. A 10 mm stabilized rotating platform polyethylene insert was inserted and the knee was placed through a range of motion. Excellent patellar tracking was appreciated and excellent mediolateral soft tissue balancing was appreciated. Two medium drains were placed in the wound bed and brought out through a separate stab incision to be attached to a reinfusion system. The medial parapatellar portion of the incision was reapproximated using interrupted sutures of #1 Vicryl. Subcutaneous tissue was approximated in layers using first #0 Vicryl followed by #2-0 Vicryl. Skin was closed with skin staples. Sterile dressing was applied.   The patient tolerated the procedure well. She was transported to the recovery room in stable condition.   ____________________________ Laurice Record. Holley Bouche., MD [id number] D: 01/03/2012 22:48:33 ET T: 01/04/2012 09:15:39 ET JOB#: [id number]  cc: Jeneen Rinks P. Holley Bouche., MD, <Dictator> JAMES P Holley Bouche MD ELECTRONICALLY SIGNED 01/04/2012 22:42

## 2014-11-01 NOTE — Discharge Summary (Signed)
PATIENT NAME:  Kendra Park, WIMBUSH MR#:  [id number] DATE OF BIRTH:  18-Feb-1941  DATE OF ADMISSION:  01/03/2012 DATE OF DISCHARGE:  01/06/2012  ADMITTING DIAGNOSIS: Degenerative arthrosis of the right knee.   DISCHARGE DIAGNOSIS: Degenerative arthrosis of the right knee.   HISTORY: The patient is a 74 year old female who has been followed at [facility] for progression of right knee pain. The patient had reported a several year history of progressive bilateral knee pain with the right being more symptomatic than the left. She reported significant discomfort with ascending and descending stairs. She had denied any night pain. She had reported pain both to the medial and lateral aspect of the knee. She did report some near episodes of giving way. She had not seen any significant improvement in her condition despite activity modification and intraarticular cortisone injections, as well as Synvisc injections. At the time of surgery, she was not using any type of ambulatory aid. The patient needed to use her Percocet on occasion for pain management. The pain had progressed to the point that it was significantly interfering with her activities of daily living. X-rays taken in the [place] showed valgus deformity of the right knee. She was noted to have bone-on-bone to the lateral compartment space. Subchondral sclerosis as well as osteophyte formation was noted. After discussion of the risks and benefits of surgical intervention, the patient expressed her understanding of the risks and benefits and agreed with plans for surgical intervention.   PROCEDURE: Right total knee arthroplasty using computer-assisted navigation.   ANESTHESIA: Femoral nerve block with spinal.   SOFT TISSUE RELEASE: Anterior cruciate ligament, posterior cruciate ligament, deep medial collateral ligament, and patellofemoral ligament as well as the posterior lateral corner.   IMPLANTS UTILIZED: DePuy PFC  Sigma size 3 posterior stabilized femoral component (cemented), size 2.5 MBT tibial component (cemented), 35 mm three pegged oval dome patella (cemented), and a 10 mm stabilized rotating platform polyethylene insert.     HOSPITAL COURSE: The patient tolerated the procedure very well. She had no complications. She was then taken to PAC-U where she was stabilized and then transferred to the orthopedic floor. She began receiving anticoagulation therapy of Lovenox 30 mg subcutaneous every 12 hours per anesthesia and pharmacy protocol. She was fitted with TED stockings bilaterally. These were allowed to be removed one hour per eight hour shift. The right one was applied on day two following removal of the Hemovac and dressing change. The patient was also fitted with the AV-I compression foot pumps set at 80 mmHg bilaterally. Calves were nontender. She had no evidence of any deep venous thromboses. Negative Homans sign. Her heels were elevated off the bed using rolled towels.   The patient has denied any chest pain or shortness of breath. Vital signs have been stable. She has been afebrile. Hemodynamically she was stable and no transfusions were given other than the Autovac transfusions given the first six hours postoperatively.   The patient began receiving physical therapy on day one for gait training and transfers. Upon being discharged, she was ambulating greater than 200 feet. She was able to go up and down four sets of steps. She was independent with bed to chair transfers. Occupational therapy was also initiated on day one for activities of daily living and assistive devices.   The patient's IV and Hemovac were discontinued on day two along with a dressing change. The Polar Care was reapplied to the surgical leg maintaining a temperature of 40 to 50  degrees Fahrenheit. The wound has been free of any drainage or any signs of infection.   DISPOSITION: The patient is being discharged to home in improved  stable condition.   DISCHARGE INSTRUCTIONS:  1. She may weight bear as tolerated. Continue using a walker until cleared by physical therapy to go to a quad cane. She will receive home health physical therapy.  2. She is to continue wearing the stockings during the day but may remove these at night.  3. She is to continue wearing the Polar Care maintaining a temperature of 40 to 50 degrees Fahrenheit. Recommend that she try to wear this as much as she can 24 hours a day for the next 2 weeks, until seen in the office.  4. She does have a follow-up appointment on 01/18/2012 at 9:15 in the office. She is to call the clinic sooner if any temperatures of 101.5 or greater or excessive bleeding.  5. She is placed on a regular diet.  6. She is to resume her regular medication that she was on prior to admission. She was given a prescription for Lovenox 40 mg subcutaneously daily for 14 days then discontinue and begin taking one 81 mg enteric-coated aspirin. Also a prescription for Roxicodone 5 to 10 mg every 4 to 6 hours p.r.n. for pain and Ultram 50 to 100 mg every 4 to 6 hours p.r.n. for pain.   PAST MEDICAL HISTORY:  1. Seasonal allergies.  2. Hypothyroidism.  3. Ovarian cancer.  4. Hypertension.  5. Arthritis.  6. Osteoporosis. ____________________________ Vance Peper, PA [id number] D: 01/06/2012 07:52:30 ET T: 01/06/2012 11:01:20 ET JOB#: [id number]  cc: Vance Peper, PA, <Dictator> JON WOLFE PA ELECTRONICALLY SIGNED 01/06/2012 21:09

## 2014-12-02 ENCOUNTER — Ambulatory Visit (INDEPENDENT_AMBULATORY_CARE_PROVIDER_SITE_OTHER): Payer: PPO | Admitting: Podiatry

## 2014-12-02 DIAGNOSIS — B351 Tinea unguium: Secondary | ICD-10-CM

## 2014-12-02 DIAGNOSIS — M79606 Pain in leg, unspecified: Secondary | ICD-10-CM | POA: Diagnosis not present

## 2014-12-02 NOTE — Progress Notes (Signed)
Patient presents to the office today with a chief complaint of painful elongated toenails.  Objective: Pulses are palpable bilateral. Nails are thick yellow dystrophic clinically mycotic and painful palpation.  Assessment: Pain in limb secondary to onychomycosis 1 through 5 bilateral.  Plan: Debridement of nails 1 through 5 bilateral covered service secondary to pain.  

## 2015-02-15 ENCOUNTER — Emergency Department: Payer: PPO

## 2015-02-15 ENCOUNTER — Encounter: Payer: Self-pay | Admitting: *Deleted

## 2015-02-15 ENCOUNTER — Emergency Department
Admission: EM | Admit: 2015-02-15 | Discharge: 2015-02-15 | Disposition: A | Discharge status: Home / Self Care | Payer: PPO | Attending: Emergency Medicine | Admitting: Emergency Medicine

## 2015-02-15 DIAGNOSIS — R1915 Other abnormal bowel sounds: Secondary | ICD-10-CM | POA: Diagnosis not present

## 2015-02-15 DIAGNOSIS — I10 Essential (primary) hypertension: Secondary | ICD-10-CM | POA: Diagnosis not present

## 2015-02-15 DIAGNOSIS — R079 Chest pain, unspecified: Secondary | ICD-10-CM | POA: Insufficient documentation

## 2015-02-15 DIAGNOSIS — Z79899 Other long term (current) drug therapy: Secondary | ICD-10-CM | POA: Insufficient documentation

## 2015-02-15 DIAGNOSIS — M549 Dorsalgia, unspecified: Secondary | ICD-10-CM | POA: Insufficient documentation

## 2015-02-15 DIAGNOSIS — Z880 Allergy status to penicillin: Secondary | ICD-10-CM | POA: Insufficient documentation

## 2015-02-15 HISTORY — DX: Malignant (primary) neoplasm, unspecified: C80.1

## 2015-02-15 HISTORY — DX: Unspecified asthma, uncomplicated: J45.909

## 2015-02-15 HISTORY — DX: Essential (primary) hypertension: I10

## 2015-02-15 HISTORY — DX: Disorder of thyroid, unspecified: E07.9

## 2015-02-15 LAB — BASIC METABOLIC PANEL
Anion gap: 9 (ref 5–15)
BUN: 18 mg/dL (ref 6–20)
CO2: 26 mmol/L (ref 22–32)
Calcium: 10.4 mg/dL — ABNORMAL HIGH (ref 8.9–10.3)
Chloride: 102 mmol/L (ref 101–111)
Creatinine, Ser: 0.66 mg/dL (ref 0.44–1.00)
GFR calc Af Amer: >60 mL/min (ref >60)
GFR calc non Af Amer: >60 mL/min (ref >60)
Glucose, Bld: 131 mg/dL — ABNORMAL HIGH (ref 65–99)
POTASSIUM: 4.1 mmol/L (ref 3.5–5.1)
SODIUM: 137 mmol/L (ref 135–145)

## 2015-02-15 LAB — CBC
HCT: 38.1 % (ref 35.0–47.0)
Hemoglobin: 12.9 g/dL (ref 12.0–16.0)
MCH: 30.6 pg (ref 26.0–34.0)
MCHC: 33.8 g/dL (ref 32.0–36.0)
MCV: 90.3 fL (ref 80.0–100.0)
PLATELETS: 388 10*3/uL (ref 150–440)
RBC: 4.22 MIL/uL (ref 3.80–5.20)
RDW: 13.6 % (ref 11.5–14.5)
WBC: 12.1 10*3/uL — AB (ref 3.6–11.0)

## 2015-02-15 LAB — TROPONIN I: Troponin I: <0.03 ng/mL (ref <0.031)

## 2015-02-15 MED ORDER — OXYCODONE-ACETAMINOPHEN 5-325 MG PO TABS: 1.0000 | ORAL_TABLET | Freq: Four times a day (QID) | ORAL | Status: DC | PRN

## 2015-02-15 MED ORDER — IOHEXOL 350 MG/ML SOLN
75.0000 mL | Freq: Once | INTRAVENOUS | Status: AC | PRN
  Administered 2015-02-15: 75 mL via INTRAVENOUS

## 2015-02-15 MED ORDER — OXYCODONE-ACETAMINOPHEN 5-325 MG PO TABS
ORAL_TABLET | ORAL | Status: AC
  Administered 2015-02-15: 1 via ORAL
  Filled 2015-02-15: qty 1

## 2015-02-15 MED ORDER — OXYCODONE-ACETAMINOPHEN 5-325 MG PO TABS
1.0000 | ORAL_TABLET | Freq: Once | ORAL | Status: AC
  Administered 2015-02-15: 1 via ORAL

## 2015-02-15 NOTE — ED Notes (Signed)
Patient transported to CT 

## 2015-02-15 NOTE — ED Provider Notes (Signed)
-----------------------------------------   9:39 AM on 02/15/2015 -----------------------------------------  Patient care assumed from Dr. Dahlia Client. Patient's second troponin is negative. Her CT scan is negative for blood clot, but does state a 7 mm right upper lung nodule. I have informed the patient this finding, she has a history of ovarian cancer 6 years ago, and she has a CT scan scheduled for September 1 at [facility]. I will print the CT report for her to bring to her [place]. We will discharge patient home with a short course of pain medication, and primary care follow-up. I discussed strict return precautions with the patient which she is agreeable.  Harvest Dark, MD 02/15/15 [phone]

## 2015-02-15 NOTE — Discharge Instructions (Signed)

## 2015-02-15 NOTE — ED Notes (Signed)
Pt c/o R sided chest pain radiating to back starting last night at 2200. Pt c/o n/v x 1. Pt took ASA 81 mg x 2 at 0400.

## 2015-02-15 NOTE — ED Provider Notes (Signed)
[facility] Emergency Department Provider Note  ____________________________________________  Time seen: Approximately 606 AM  I have reviewed the triage vital signs and the nursing notes.   HISTORY  Chief Complaint Chest Pain   HPI Kendra Park is a 74 y.o. female who comes in with chest pain. The patient reports that she is concerned about having a heart attack. She reports that the pain is right sided and goes through to her back and around to her shoulder blade. She reports that the pain started around 10:00. The patient took some coated aspirin vomited and then took more aspirin. She denies any shortness of breath or sweats. He reports that she has never had this pain before. The patient is a 8-9 out of 10 in intensity and sharp and jabbing. The patient denies any falls or trauma. The patient was concerned as the pain was persistent so she decided to come in for evaluation. She denies any abdominal pain or leg swelling she has had some mild headache.   Past Medical History  Diagnosis Date  . Hypertension   . Thyroid disease   . Cancer     There are no active problems to display for this patient.   Past Surgical History  Procedure Laterality Date  . Abdominal surgery    . Cholecystectomy    . Abdominal hysterectomy      Current Outpatient Rx  Name  Route  Sig  Dispense  Refill  . hydrochlorothiazide (HYDRODIURIL) 25 MG tablet   Oral   Take 25 mg by mouth daily.         Marland Kitchen levothyroxine (SYNTHROID, LEVOTHROID) 100 MCG tablet   Oral   Take 100 mcg by mouth daily before breakfast.         . valsartan (DIOVAN) 160 MG tablet   Oral   Take 160 mg by mouth daily.           Allergies Penicillins  History reviewed. No pertinent family history.  Social History History  Substance Use Topics  . Smoking status: Never Smoker   . Smokeless tobacco: Not on file  . Alcohol Use: No    Review of Systems Constitutional: No  fever/chills Eyes: No visual changes. ENT: No sore throat. Cardiovascular:  chest pain. Respiratory: Denies shortness of breath. Gastrointestinal: No abdominal pain,  nausea, vomiting.  No diarrhea.  No constipation. Genitourinary: Negative for dysuria. Musculoskeletal:  back pain. Skin: Negative for rash. Neurological: Negative for headaches, focal weakness or numbness.  10-point ROS otherwise negative.  ____________________________________________   PHYSICAL EXAM:  VITAL SIGNS: ED Triage Vitals  Enc Vitals Group     BP 02/15/15 0534 193/80 mmHg     Pulse Rate 02/15/15 0534 78     Resp 02/15/15 0534 20     Temp 02/15/15 0534 97.9 F (36.6 C)     Temp Source 02/15/15 0534 Oral     SpO2 02/15/15 0534 94 %     Weight 02/15/15 0534 157 lb (71.215 kg)     Height 02/15/15 0534 5\' 4"  (1.626 m)     Head Cir --      Peak Flow --      Pain Score 02/15/15 0535 8     Pain Loc --      Pain Edu? --      Excl. in [place]? --     Constitutional: Alert and oriented. Well appearing and in moderate distress. Eyes: Conjunctivae are normal. PERRL. EOMI. Head: Atraumatic. Nose: No congestion/rhinnorhea.  Mouth/Throat: Mucous membranes are moist.  Oropharynx non-erythematous. Cardiovascular: Normal rate, regular rhythm. Grossly normal heart sounds.  Good peripheral circulation. Respiratory: Normal respiratory effort.  No retractions. Lungs CTAB. ttp right back around shoulder blade Gastrointestinal: Soft and nontender. No distention. Positive bowel sounds Genitourinary: Deferred Musculoskeletal: No lower extremity tenderness nor edema.   Neurologic:  Normal speech and language. No gross focal neurologic deficits are appreciated.  Skin:  Skin is warm, dry and intact. Psychiatric: Mood and affect are normal.   ____________________________________________   LABS (all labs ordered are listed, but only abnormal results are displayed)  Labs Reviewed  BASIC METABOLIC PANEL - Abnormal; Notable  for the following:    Glucose, Bld 131 (*)    Calcium 10.4 (*)    All other components within normal limits  CBC - Abnormal; Notable for the following:    WBC 12.1 (*)    All other components within normal limits  TROPONIN I  TROPONIN I   ____________________________________________  EKG  ED ECG REPORT I, Loney Hering, the attending physician, personally viewed and interpreted this ECG.   Date: 02/15/2015  EKG Time: 527  Rate: 80  Rhythm: normal sinus rhythm  Axis: normal  Intervals:none  ST&T Change: none  ____________________________________________  RADIOLOGY  CXR: Findings of COPD, mild basilar atelectasis ____________________________________________   PROCEDURES  Procedure(s) performed: None  Critical Care performed: No  ____________________________________________   INITIAL IMPRESSION / ASSESSMENT AND PLAN / ED COURSE  Pertinent labs & imaging results that were available during my care of the patient were reviewed by me and considered in my medical decision making (see chart for details).  This is a 74 year old female who comes in with some right-sided chest pain that radiates around her back. The patient is in some significant discomfort. She does not have any lesions or vesicles along the area of pain on her skin. Although the patient's blood work is unremarkable I will do a CT scan of the patient's chest to evaluate for possible clot in her lungs or other pathology. Otherwise the patient receive a dose of Percocet and is comfortable currently. I will sign the patient's care out to Dr. Harvest Dark who will follow up the results of the CT and a repeat troponin and repeat the troponin. ____________________________________________   FINAL CLINICAL IMPRESSION(S) / ED DIAGNOSES  Final diagnoses:  Chest pain, unspecified chest pain type      Loney Hering, MD 02/15/15 [phone]

## 2015-02-19 DIAGNOSIS — R911 Solitary pulmonary nodule: Secondary | ICD-10-CM | POA: Insufficient documentation

## 2015-03-08 ENCOUNTER — Encounter: Payer: Self-pay | Admitting: Podiatry

## 2015-03-08 ENCOUNTER — Ambulatory Visit (INDEPENDENT_AMBULATORY_CARE_PROVIDER_SITE_OTHER): Payer: PPO | Admitting: Podiatry

## 2015-03-08 DIAGNOSIS — B351 Tinea unguium: Secondary | ICD-10-CM | POA: Diagnosis not present

## 2015-03-08 DIAGNOSIS — M79606 Pain in leg, unspecified: Secondary | ICD-10-CM | POA: Diagnosis not present

## 2015-03-08 NOTE — Progress Notes (Signed)
Patient presents to the office today with a chief complaint of painful elongated toenails. She also presents for painful corns and calluses.  Objective: Pulses are palpable bilateral. Nails are thick yellow dystrophic clinically mycotic and painful palpation. Thick porokeratotic lesions plantar aspect bilateral foot  Assessment: Pain in limb secondary to onychomycosis 1 through 5 bilateral. Painful porokeratosis bilateral.  Plan: Debridement of nails 1 through 5 bilateral covered service secondary to pain . HD lat. 3rd R.   3 mo. redirect hyperkeratosis.  Roselind Messier DPM

## 2015-04-20 DIAGNOSIS — Z889 Allergy status to unspecified drugs, medicaments and biological substances status: Secondary | ICD-10-CM | POA: Insufficient documentation

## 2015-04-20 DIAGNOSIS — Z5111 Encounter for antineoplastic chemotherapy: Secondary | ICD-10-CM | POA: Insufficient documentation

## 2015-06-08 ENCOUNTER — Encounter: Payer: Self-pay | Admitting: Sports Medicine

## 2015-06-08 ENCOUNTER — Ambulatory Visit: Payer: PPO

## 2015-06-08 ENCOUNTER — Ambulatory Visit (INDEPENDENT_AMBULATORY_CARE_PROVIDER_SITE_OTHER): Payer: PPO | Admitting: Sports Medicine

## 2015-06-08 DIAGNOSIS — L84 Corns and callosities: Secondary | ICD-10-CM | POA: Diagnosis not present

## 2015-06-08 DIAGNOSIS — M79675 Pain in left toe(s): Secondary | ICD-10-CM

## 2015-06-08 DIAGNOSIS — B351 Tinea unguium: Secondary | ICD-10-CM

## 2015-06-08 DIAGNOSIS — M79674 Pain in right toe(s): Secondary | ICD-10-CM

## 2015-06-08 DIAGNOSIS — M21619 Bunion of unspecified foot: Secondary | ICD-10-CM

## 2015-06-08 DIAGNOSIS — M2040 Other hammer toe(s) (acquired), unspecified foot: Secondary | ICD-10-CM | POA: Diagnosis not present

## 2015-06-08 NOTE — Progress Notes (Signed)
Patient ID: Kendra Park, female   DOB: November 03, 1940, 74 y.o.   MRN: [id number] Subjective: Kendra Park is a 74 y.o. female patient seen today in office with complaint of painful callus and thickened and elongated toenails; unable to trim. 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 Patient denies history of Diabetes, Neuropathy, or Vascular disease. Patient has no other pedal complaints at this time.   There are no active problems to display for this patient.  Current Outpatient Prescriptions on File Prior to Visit  Medication Sig Dispense Refill  . hydrochlorothiazide (HYDRODIURIL) 25 MG tablet Take 25 mg by mouth daily.    Marland Kitchen levothyroxine (SYNTHROID, LEVOTHROID) 100 MCG tablet Take 100 mcg by mouth daily before breakfast.    . oxyCODONE-acetaminophen (ROXICET) 5-325 MG per tablet Take 1 tablet by mouth every 6 (six) hours as needed. 15 tablet 0  . valsartan (DIOVAN) 160 MG tablet Take 160 mg by mouth daily.     No current facility-administered medications on file prior to visit.   Allergies  Allergen Reactions  . Penicillins Rash    Objective: Physical Exam  General: Well developed, nourished, no acute distress, awake, alert and oriented x 3  Vascular: Dorsalis pedis artery 1/4 bilateral, Posterior tibial artery 1/4 bilateral, skin temperature warm to warm proximal to distal bilateral lower extremities, mild varicosities, scant pedal hair present bilateral.  Neurological: Gross sensation present via light touch bilateral.   Dermatological: Skin is warm, dry, and supple bilateral, Nails 1-10 are tender, long, thick, and discolored with mild subungal debris, no webspace macerations present bilateral, no open lesions present bilateral, callus/hyperkeratotic tissue present lateral aspect of right 3rd toe. No signs of infection bilateral.  Musculoskeletal: Right bunion and lesser hammertoe boney  deformities noted bilateral. Muscular strength within normal limits without pain or limitation on range of motion. No pain with calf compression bilateral.  Assessment and Plan:  Problem List Items Addressed This Visit    None    Visit Diagnoses    Dermatophytosis of nail    -  Primary    Callus of foot        right 3rd toe lateral aspect    Pain in toes of both feet        Hammertoe, unspecified laterality        Bunion        R       -Examined patient.  -Discussed treatment options for painful mycotic nails and callus. -Mechanically debrided and reduced mycotic nails with sterile nail nipper and dremel nail file without incident. -Debrided callus x 1 at right 3rd toe using sterile blade without incident -Gave patient as a courtesy a silicone toe spacer to wear when ambulating to right 3rd toe -Recommend good supportive shoes daily for foot type  -Patient to return in 3 months for follow up evaluation or sooner if symptoms worsen.  Landis Martins, DPM

## 2015-07-20 DIAGNOSIS — R911 Solitary pulmonary nodule: Secondary | ICD-10-CM | POA: Diagnosis not present

## 2015-07-20 DIAGNOSIS — J45909 Unspecified asthma, uncomplicated: Secondary | ICD-10-CM | POA: Diagnosis not present

## 2015-07-20 DIAGNOSIS — Z889 Allergy status to unspecified drugs, medicaments and biological substances status: Secondary | ICD-10-CM | POA: Diagnosis not present

## 2015-07-20 DIAGNOSIS — I120 Hypertensive chronic kidney disease with stage 5 chronic kidney disease or end stage renal disease: Secondary | ICD-10-CM | POA: Diagnosis not present

## 2015-07-20 DIAGNOSIS — R112 Nausea with vomiting, unspecified: Secondary | ICD-10-CM | POA: Diagnosis not present

## 2015-07-20 DIAGNOSIS — Z5111 Encounter for antineoplastic chemotherapy: Secondary | ICD-10-CM | POA: Diagnosis not present

## 2015-07-20 DIAGNOSIS — M1990 Unspecified osteoarthritis, unspecified site: Secondary | ICD-10-CM | POA: Diagnosis not present

## 2015-07-20 DIAGNOSIS — Z90710 Acquired absence of both cervix and uterus: Secondary | ICD-10-CM | POA: Diagnosis not present

## 2015-07-20 DIAGNOSIS — C569 Malignant neoplasm of unspecified ovary: Secondary | ICD-10-CM | POA: Diagnosis not present

## 2015-07-20 DIAGNOSIS — K1230 Oral mucositis (ulcerative), unspecified: Secondary | ICD-10-CM | POA: Diagnosis not present

## 2015-07-20 DIAGNOSIS — Z880 Allergy status to penicillin: Secondary | ICD-10-CM | POA: Diagnosis not present

## 2015-07-20 DIAGNOSIS — I878 Other specified disorders of veins: Secondary | ICD-10-CM | POA: Diagnosis not present

## 2015-07-20 DIAGNOSIS — T451X5A Adverse effect of antineoplastic and immunosuppressive drugs, initial encounter: Secondary | ICD-10-CM | POA: Diagnosis not present

## 2015-07-20 DIAGNOSIS — Z5181 Encounter for therapeutic drug level monitoring: Secondary | ICD-10-CM | POA: Diagnosis not present

## 2015-07-20 DIAGNOSIS — Z96659 Presence of unspecified artificial knee joint: Secondary | ICD-10-CM | POA: Diagnosis not present

## 2015-07-20 DIAGNOSIS — M810 Age-related osteoporosis without current pathological fracture: Secondary | ICD-10-CM | POA: Diagnosis not present

## 2015-07-20 DIAGNOSIS — K1379 Other lesions of oral mucosa: Secondary | ICD-10-CM | POA: Diagnosis not present

## 2015-07-20 DIAGNOSIS — R51 Headache: Secondary | ICD-10-CM | POA: Diagnosis not present

## 2015-07-20 DIAGNOSIS — C787 Secondary malignant neoplasm of liver and intrahepatic bile duct: Secondary | ICD-10-CM | POA: Diagnosis not present

## 2015-07-20 DIAGNOSIS — C786 Secondary malignant neoplasm of retroperitoneum and peritoneum: Secondary | ICD-10-CM | POA: Diagnosis not present

## 2015-07-20 DIAGNOSIS — C7951 Secondary malignant neoplasm of bone: Secondary | ICD-10-CM | POA: Diagnosis not present

## 2015-07-20 DIAGNOSIS — Z79899 Other long term (current) drug therapy: Secondary | ICD-10-CM | POA: Diagnosis not present

## 2015-07-20 DIAGNOSIS — I10 Essential (primary) hypertension: Secondary | ICD-10-CM | POA: Diagnosis not present

## 2015-07-20 DIAGNOSIS — E039 Hypothyroidism, unspecified: Secondary | ICD-10-CM | POA: Diagnosis not present

## 2015-07-20 DIAGNOSIS — C7989 Secondary malignant neoplasm of other specified sites: Secondary | ICD-10-CM | POA: Diagnosis not present

## 2015-07-21 DIAGNOSIS — Z889 Allergy status to unspecified drugs, medicaments and biological substances status: Secondary | ICD-10-CM | POA: Diagnosis not present

## 2015-07-21 DIAGNOSIS — C569 Malignant neoplasm of unspecified ovary: Secondary | ICD-10-CM | POA: Diagnosis not present

## 2015-07-21 DIAGNOSIS — Z5111 Encounter for antineoplastic chemotherapy: Secondary | ICD-10-CM | POA: Diagnosis not present

## 2015-07-23 DIAGNOSIS — Z5111 Encounter for antineoplastic chemotherapy: Secondary | ICD-10-CM | POA: Diagnosis not present

## 2015-07-23 DIAGNOSIS — I878 Other specified disorders of veins: Secondary | ICD-10-CM | POA: Diagnosis not present

## 2015-07-23 DIAGNOSIS — C569 Malignant neoplasm of unspecified ovary: Secondary | ICD-10-CM | POA: Diagnosis not present

## 2015-08-02 DIAGNOSIS — C569 Malignant neoplasm of unspecified ovary: Secondary | ICD-10-CM | POA: Diagnosis not present

## 2015-08-17 DIAGNOSIS — Z90722 Acquired absence of ovaries, bilateral: Secondary | ICD-10-CM | POA: Diagnosis not present

## 2015-08-17 DIAGNOSIS — T451X5A Adverse effect of antineoplastic and immunosuppressive drugs, initial encounter: Secondary | ICD-10-CM | POA: Diagnosis not present

## 2015-08-17 DIAGNOSIS — E039 Hypothyroidism, unspecified: Secondary | ICD-10-CM | POA: Diagnosis not present

## 2015-08-17 DIAGNOSIS — Z5111 Encounter for antineoplastic chemotherapy: Secondary | ICD-10-CM | POA: Diagnosis not present

## 2015-08-17 DIAGNOSIS — J45909 Unspecified asthma, uncomplicated: Secondary | ICD-10-CM | POA: Diagnosis not present

## 2015-08-17 DIAGNOSIS — M1990 Unspecified osteoarthritis, unspecified site: Secondary | ICD-10-CM | POA: Diagnosis not present

## 2015-08-17 DIAGNOSIS — Z880 Allergy status to penicillin: Secondary | ICD-10-CM | POA: Diagnosis not present

## 2015-08-17 DIAGNOSIS — C786 Secondary malignant neoplasm of retroperitoneum and peritoneum: Secondary | ICD-10-CM | POA: Diagnosis not present

## 2015-08-17 DIAGNOSIS — Z780 Asymptomatic menopausal state: Secondary | ICD-10-CM | POA: Diagnosis not present

## 2015-08-17 DIAGNOSIS — Z8542 Personal history of malignant neoplasm of other parts of uterus: Secondary | ICD-10-CM | POA: Diagnosis not present

## 2015-08-17 DIAGNOSIS — Z90710 Acquired absence of both cervix and uterus: Secondary | ICD-10-CM | POA: Diagnosis not present

## 2015-08-17 DIAGNOSIS — Z9049 Acquired absence of other specified parts of digestive tract: Secondary | ICD-10-CM | POA: Diagnosis not present

## 2015-08-17 DIAGNOSIS — M810 Age-related osteoporosis without current pathological fracture: Secondary | ICD-10-CM | POA: Diagnosis not present

## 2015-08-17 DIAGNOSIS — K1230 Oral mucositis (ulcerative), unspecified: Secondary | ICD-10-CM | POA: Diagnosis not present

## 2015-08-17 DIAGNOSIS — C7951 Secondary malignant neoplasm of bone: Secondary | ICD-10-CM | POA: Diagnosis not present

## 2015-08-17 DIAGNOSIS — R112 Nausea with vomiting, unspecified: Secondary | ICD-10-CM | POA: Diagnosis not present

## 2015-08-17 DIAGNOSIS — Z516 Encounter for desensitization to allergens: Secondary | ICD-10-CM | POA: Diagnosis not present

## 2015-08-17 DIAGNOSIS — I10 Essential (primary) hypertension: Secondary | ICD-10-CM | POA: Diagnosis not present

## 2015-08-17 DIAGNOSIS — I878 Other specified disorders of veins: Secondary | ICD-10-CM | POA: Diagnosis not present

## 2015-08-17 DIAGNOSIS — Z889 Allergy status to unspecified drugs, medicaments and biological substances status: Secondary | ICD-10-CM | POA: Diagnosis not present

## 2015-08-17 DIAGNOSIS — C569 Malignant neoplasm of unspecified ovary: Secondary | ICD-10-CM | POA: Diagnosis not present

## 2015-08-17 DIAGNOSIS — Z96659 Presence of unspecified artificial knee joint: Secondary | ICD-10-CM | POA: Diagnosis not present

## 2015-08-17 DIAGNOSIS — R110 Nausea: Secondary | ICD-10-CM | POA: Diagnosis not present

## 2015-08-18 DIAGNOSIS — Z889 Allergy status to unspecified drugs, medicaments and biological substances status: Secondary | ICD-10-CM | POA: Diagnosis not present

## 2015-08-18 DIAGNOSIS — C569 Malignant neoplasm of unspecified ovary: Secondary | ICD-10-CM | POA: Diagnosis not present

## 2015-08-18 DIAGNOSIS — T451X5A Adverse effect of antineoplastic and immunosuppressive drugs, initial encounter: Secondary | ICD-10-CM | POA: Diagnosis not present

## 2015-08-18 DIAGNOSIS — R112 Nausea with vomiting, unspecified: Secondary | ICD-10-CM | POA: Diagnosis not present

## 2015-08-20 DIAGNOSIS — C569 Malignant neoplasm of unspecified ovary: Secondary | ICD-10-CM | POA: Diagnosis not present

## 2015-08-20 DIAGNOSIS — Z5111 Encounter for antineoplastic chemotherapy: Secondary | ICD-10-CM | POA: Diagnosis not present

## 2015-09-07 ENCOUNTER — Encounter: Payer: Self-pay | Admitting: Sports Medicine

## 2015-09-07 ENCOUNTER — Ambulatory Visit (INDEPENDENT_AMBULATORY_CARE_PROVIDER_SITE_OTHER): Payer: PPO | Admitting: Sports Medicine

## 2015-09-07 DIAGNOSIS — M21619 Bunion of unspecified foot: Secondary | ICD-10-CM

## 2015-09-07 DIAGNOSIS — M79674 Pain in right toe(s): Secondary | ICD-10-CM | POA: Diagnosis not present

## 2015-09-07 DIAGNOSIS — M79675 Pain in left toe(s): Secondary | ICD-10-CM

## 2015-09-07 DIAGNOSIS — B351 Tinea unguium: Secondary | ICD-10-CM

## 2015-09-07 DIAGNOSIS — M2040 Other hammer toe(s) (acquired), unspecified foot: Secondary | ICD-10-CM

## 2015-09-07 DIAGNOSIS — L84 Corns and callosities: Secondary | ICD-10-CM | POA: Diagnosis not present

## 2015-09-07 NOTE — Progress Notes (Signed)
Patient ID: [name], female   DOB: [birth year], 75 y.o.   MRN: [id number]  Subjective: LUIZA POWLEY is a 75 y.o. female patient seen today in office with complaint of painful callus and thickened and elongated toenails; unable to trim. Patient denies history of Diabetes, Neuropathy, or Vascular disease. Patient has no other pedal complaints at this time.   There are no active problems to display for this patient.  Current Outpatient Prescriptions on File Prior to Visit  Medication Sig Dispense Refill  . hydrochlorothiazide (HYDRODIURIL) 25 MG tablet Take 25 mg by mouth daily.    Marland Kitchen levothyroxine (SYNTHROID, LEVOTHROID) 100 MCG tablet Take 100 mcg by mouth daily before breakfast.    . oxyCODONE-acetaminophen (ROXICET) 5-325 MG per tablet Take 1 tablet by mouth every 6 (six) hours as needed. 15 tablet 0  . valsartan (DIOVAN) 160 MG tablet Take 160 mg by mouth daily.     No current facility-administered medications on file prior to visit.   Allergies  Allergen Reactions  . Penicillins Rash    Objective: Physical Exam  General: Well developed, nourished, no acute distress, awake, alert and oriented x 3  Vascular: Dorsalis pedis artery 1/4 bilateral, Posterior tibial artery 1/4 bilateral, skin temperature warm to warm proximal to distal bilateral lower extremities, mild varicosities, scant pedal hair present bilateral.  Neurological: Gross sensation present via light touch bilateral.   Dermatological: Skin is warm, dry, and supple bilateral, Nails 1-10 are tender, long, thick, and discolored with mild subungal debris, no webspace macerations present bilateral, no open lesions present bilateral, callus/hyperkeratotic tissue present lateral aspect of right 3rd toe. No signs of infection bilateral.  Musculoskeletal: Right bunion and lesser hammertoe boney deformities noted bilateral. Muscular strength within normal limits without pain or limitation on range of motion. No pain with calf  compression bilateral.  Assessment and Plan:  Problem List Items Addressed This Visit    None    Visit Diagnoses    Dermatophytosis of nail    -  Primary    Callus of foot        Pain in toes of both feet        Hammertoe, unspecified laterality        Bunion           -Examined patient.  -Discussed treatment options for painful mycotic nails and callus. -Mechanically debrided and reduced mycotic nails with sterile nail nipper and dremel nail file without incident. -Debrided callus x 1 at right 3rd toe using sterile blade without incident -Cont with silicone toe spacer to wear when ambulating to right 3rd toe -Recommend good supportive shoes daily for foot type  -Patient to return in 3 months for follow up evaluation or sooner if symptoms worsen.  Landis Martins, DPM

## 2015-09-14 DIAGNOSIS — K253 Acute gastric ulcer without hemorrhage or perforation: Secondary | ICD-10-CM | POA: Diagnosis not present

## 2015-09-14 DIAGNOSIS — R911 Solitary pulmonary nodule: Secondary | ICD-10-CM | POA: Diagnosis not present

## 2015-09-14 DIAGNOSIS — K1370 Unspecified lesions of oral mucosa: Secondary | ICD-10-CM | POA: Diagnosis not present

## 2015-09-14 DIAGNOSIS — Z9221 Personal history of antineoplastic chemotherapy: Secondary | ICD-10-CM | POA: Diagnosis not present

## 2015-09-14 DIAGNOSIS — Z880 Allergy status to penicillin: Secondary | ICD-10-CM | POA: Diagnosis not present

## 2015-09-14 DIAGNOSIS — K1379 Other lesions of oral mucosa: Secondary | ICD-10-CM | POA: Diagnosis not present

## 2015-09-14 DIAGNOSIS — C787 Secondary malignant neoplasm of liver and intrahepatic bile duct: Secondary | ICD-10-CM | POA: Diagnosis not present

## 2015-09-14 DIAGNOSIS — Z90722 Acquired absence of ovaries, bilateral: Secondary | ICD-10-CM | POA: Diagnosis not present

## 2015-09-14 DIAGNOSIS — Z79899 Other long term (current) drug therapy: Secondary | ICD-10-CM | POA: Diagnosis not present

## 2015-09-14 DIAGNOSIS — R112 Nausea with vomiting, unspecified: Secondary | ICD-10-CM | POA: Diagnosis not present

## 2015-09-14 DIAGNOSIS — Z9889 Other specified postprocedural states: Secondary | ICD-10-CM | POA: Diagnosis not present

## 2015-09-14 DIAGNOSIS — Z889 Allergy status to unspecified drugs, medicaments and biological substances status: Secondary | ICD-10-CM | POA: Diagnosis not present

## 2015-09-14 DIAGNOSIS — Z5111 Encounter for antineoplastic chemotherapy: Secondary | ICD-10-CM | POA: Diagnosis not present

## 2015-09-14 DIAGNOSIS — Z90710 Acquired absence of both cervix and uterus: Secondary | ICD-10-CM | POA: Diagnosis not present

## 2015-09-14 DIAGNOSIS — T451X5A Adverse effect of antineoplastic and immunosuppressive drugs, initial encounter: Secondary | ICD-10-CM | POA: Diagnosis not present

## 2015-09-14 DIAGNOSIS — C569 Malignant neoplasm of unspecified ovary: Secondary | ICD-10-CM | POA: Diagnosis not present

## 2015-10-12 DIAGNOSIS — C569 Malignant neoplasm of unspecified ovary: Secondary | ICD-10-CM | POA: Diagnosis not present

## 2015-10-19 DIAGNOSIS — C569 Malignant neoplasm of unspecified ovary: Secondary | ICD-10-CM | POA: Diagnosis not present

## 2015-12-10 ENCOUNTER — Ambulatory Visit: Payer: PPO | Admitting: Sports Medicine

## 2015-12-21 DIAGNOSIS — Z79899 Other long term (current) drug therapy: Secondary | ICD-10-CM | POA: Diagnosis not present

## 2015-12-21 DIAGNOSIS — C787 Secondary malignant neoplasm of liver and intrahepatic bile duct: Secondary | ICD-10-CM | POA: Diagnosis not present

## 2015-12-21 DIAGNOSIS — Z9221 Personal history of antineoplastic chemotherapy: Secondary | ICD-10-CM | POA: Diagnosis not present

## 2015-12-21 DIAGNOSIS — C786 Secondary malignant neoplasm of retroperitoneum and peritoneum: Secondary | ICD-10-CM | POA: Diagnosis not present

## 2015-12-21 DIAGNOSIS — R911 Solitary pulmonary nodule: Secondary | ICD-10-CM | POA: Diagnosis not present

## 2015-12-21 DIAGNOSIS — C569 Malignant neoplasm of unspecified ovary: Secondary | ICD-10-CM | POA: Diagnosis not present

## 2015-12-31 ENCOUNTER — Encounter: Payer: Self-pay | Admitting: Sports Medicine

## 2015-12-31 ENCOUNTER — Ambulatory Visit (INDEPENDENT_AMBULATORY_CARE_PROVIDER_SITE_OTHER): Payer: PPO | Admitting: Sports Medicine

## 2015-12-31 DIAGNOSIS — M21619 Bunion of unspecified foot: Secondary | ICD-10-CM

## 2015-12-31 DIAGNOSIS — M810 Age-related osteoporosis without current pathological fracture: Secondary | ICD-10-CM | POA: Insufficient documentation

## 2015-12-31 DIAGNOSIS — B351 Tinea unguium: Secondary | ICD-10-CM

## 2015-12-31 DIAGNOSIS — M79675 Pain in left toe(s): Secondary | ICD-10-CM

## 2015-12-31 DIAGNOSIS — M2040 Other hammer toe(s) (acquired), unspecified foot: Secondary | ICD-10-CM

## 2015-12-31 DIAGNOSIS — J45909 Unspecified asthma, uncomplicated: Secondary | ICD-10-CM | POA: Insufficient documentation

## 2015-12-31 DIAGNOSIS — M79674 Pain in right toe(s): Secondary | ICD-10-CM | POA: Diagnosis not present

## 2015-12-31 DIAGNOSIS — L84 Corns and callosities: Secondary | ICD-10-CM

## 2015-12-31 DIAGNOSIS — M1990 Unspecified osteoarthritis, unspecified site: Secondary | ICD-10-CM | POA: Insufficient documentation

## 2015-12-31 NOTE — Progress Notes (Signed)
Patient ID: [name], female   DOB: 11/02/1940, 75 y.o.   MRN: [id number] Subjective: ALEASA DRAUGHN is a 75 y.o. female patient seen today in office with complaint of painful callus and thickened and elongated toenails; unable to trim. Patient denies any changes with medical history since last visit. Patient has no other pedal complaints at this time.   Patient Active Problem List   Diagnosis Date Noted  . Airway hyperreactivity 12/31/2015  . Arthritis, degenerative 12/31/2015  . 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 Acute gastric ulcer without hemorrhage or perforation 09/14/2015  . Encounter for antineoplastic chemotherapy 04/20/2015  . 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 Lung nodule, solitary 02/19/2015  . Chronic headache 09/19/2012  . BP (high blood pressure) 05/16/2012  . Adult hypothyroidism 05/16/2012  . History of knee surgery 05/16/2012  . 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 hydrochlorothiazide (HYDRODIURIL) 25 MG tablet Take 25 mg by mouth daily.    Marland Kitchen levothyroxine (SYNTHROID, LEVOTHROID) 100 MCG tablet Take 100 mcg by mouth daily before breakfast.    . oxyCODONE-acetaminophen (ROXICET) 5-325 MG per tablet Take 1 tablet by mouth every 6 (six) hours as needed. 15 tablet 0  . valsartan (DIOVAN) 160 MG tablet Take 160 mg by mouth daily.     No current facility-administered medications on file prior to visit.   Allergies  Allergen Reactions  . Penicillins Rash    Objective: Physical Exam  General: Well developed, nourished, no acute distress, awake, alert and oriented x 3  Vascular: Dorsalis pedis artery 1/4 bilateral, Posterior tibial artery 1/4 bilateral, skin temperature warm to warm proximal to distal bilateral lower extremities, mild varicosities, scant pedal hair present bilateral.  Neurological: Gross sensation present via light touch bilateral.    Dermatological: Skin is warm, dry, and supple bilateral, Nails 1-10 are tender, long, thick, and discolored with mild subungal debris, no webspace macerations present bilateral, no open lesions present bilateral, callus/hyperkeratotic tissue present lateral aspect of right 3rd toe. No signs of infection bilateral.  Musculoskeletal: Right bunion and lesser hammertoe boney deformities noted bilateral. Muscular strength within normal limits without pain or limitation on range of motion. No pain with calf compression bilateral.  Assessment and Plan:  Problem List Items Addressed This Visit    None    Visit Diagnoses    Dermatophytosis of nail    -  Primary    Callus of foot        Pain in toes of both feet        Hammertoe, unspecified laterality        Bunion          -Examined patient.  -Discussed treatment options for painful mycotic nails and callus. -Mechanically debrided and reduced mycotic nails with sterile nail nipper and dremel nail file without incident. -Debrided callus x 1 at right 3rd toe using sterile blade without incident -Cont with silicone toe spacer to wear when ambulating to right 3rd toe -Recommend good supportive shoes daily for foot type  -Patient to return in 3 months for follow up evaluation or sooner if symptoms worsen.  Landis Martins, DPM

## 2016-03-14 ENCOUNTER — Ambulatory Visit (INDEPENDENT_AMBULATORY_CARE_PROVIDER_SITE_OTHER): Payer: PPO | Admitting: Podiatry

## 2016-03-14 ENCOUNTER — Encounter: Payer: Self-pay | Admitting: Podiatry

## 2016-03-14 DIAGNOSIS — B351 Tinea unguium: Secondary | ICD-10-CM | POA: Diagnosis not present

## 2016-03-14 DIAGNOSIS — L84 Corns and callosities: Secondary | ICD-10-CM

## 2016-03-14 DIAGNOSIS — M2011 Hallux valgus (acquired), right foot: Secondary | ICD-10-CM

## 2016-03-14 DIAGNOSIS — M79673 Pain in unspecified foot: Secondary | ICD-10-CM

## 2016-03-14 DIAGNOSIS — M2041 Other hammer toe(s) (acquired), right foot: Secondary | ICD-10-CM

## 2016-03-14 NOTE — Progress Notes (Signed)
Subjective Patient presents today with a new complaint of a bunion to the right foot. Patient states in the past had surgery with Dr. Roselind Messier several years ago to the left lower extremity. Her left foot is nonpainful and doing well  Patient wants to discuss the surgical and conservative options relating to the new complaint of a right foot bunion deformity with hammertoes to the right foot  Patient also complains of painful dystrophic nails 1 through 5 bilaterally  Objective: Physical Exam General: The patient is alert and oriented x3 in no acute distress.  Dermatology: Thickened elongated painful nails 1 through 5 bilaterally noted. Hyperkeratotic lesion also noted to the third digit of the right foot dorsally at the level of the PIPJ. Skin is cool, dry and supple bilateral lower extremities. Negative for open lesions or macerations.  Vascular: Palpable pedal pulses bilaterally. No edema or erythema noted. Capillary refill within normal limits.  Neurological: Epicritic and protective threshold grossly intact bilaterally.   Musculoskeletal Exam: All pedal and ankle joints range of motion within normal limits bilateral. Muscle strength 5/5 in all groups bilateral. Hallux valgus deformity as well as hammertoe contractures noted 2 through 5 to the right foot    Assessment  #1 hallux abductovalgus deformity right foot #2 hammertoe contracture digits 2 through 5 right foot  #3 pain in right foot #4 hyperkeratotic nails 1 through 5 bilaterally #5 porokeratotic callus third digit right foot  Plan of care  #1 the patient was evaluated #2 discussed in detail the pros and cons of bunion surgery to the right foot #3 nails 1 through 5 bilaterally were debrided using a tissue nipper and a rotary bur #4 excisional debridement of porokeratotic lesion of the third digit right foot was performed using an chisel blade #5 patient to return to the clinic in 3 months

## 2016-03-21 DIAGNOSIS — Z9049 Acquired absence of other specified parts of digestive tract: Secondary | ICD-10-CM | POA: Diagnosis not present

## 2016-03-21 DIAGNOSIS — Z90722 Acquired absence of ovaries, bilateral: Secondary | ICD-10-CM | POA: Diagnosis not present

## 2016-03-21 DIAGNOSIS — C569 Malignant neoplasm of unspecified ovary: Secondary | ICD-10-CM | POA: Diagnosis not present

## 2016-03-21 DIAGNOSIS — Z903 Acquired absence of stomach [part of]: Secondary | ICD-10-CM | POA: Diagnosis not present

## 2016-03-21 DIAGNOSIS — Z90710 Acquired absence of both cervix and uterus: Secondary | ICD-10-CM | POA: Diagnosis not present

## 2016-03-21 DIAGNOSIS — C562 Malignant neoplasm of left ovary: Secondary | ICD-10-CM | POA: Diagnosis not present

## 2016-03-21 DIAGNOSIS — Z9221 Personal history of antineoplastic chemotherapy: Secondary | ICD-10-CM | POA: Diagnosis not present

## 2016-03-21 DIAGNOSIS — R911 Solitary pulmonary nodule: Secondary | ICD-10-CM | POA: Diagnosis not present

## 2016-03-21 DIAGNOSIS — Z9081 Acquired absence of spleen: Secondary | ICD-10-CM | POA: Diagnosis not present

## 2016-03-21 DIAGNOSIS — C561 Malignant neoplasm of right ovary: Secondary | ICD-10-CM | POA: Diagnosis not present

## 2016-03-21 DIAGNOSIS — C786 Secondary malignant neoplasm of retroperitoneum and peritoneum: Secondary | ICD-10-CM | POA: Diagnosis not present

## 2016-03-21 DIAGNOSIS — R971 Elevated cancer antigen 125 [CA 125]: Secondary | ICD-10-CM | POA: Diagnosis not present

## 2016-03-28 DIAGNOSIS — Z006 Encounter for examination for normal comparison and control in clinical research program: Secondary | ICD-10-CM | POA: Diagnosis not present

## 2016-03-30 DIAGNOSIS — C787 Secondary malignant neoplasm of liver and intrahepatic bile duct: Secondary | ICD-10-CM | POA: Diagnosis not present

## 2016-03-30 DIAGNOSIS — N803 Endometriosis of pelvic peritoneum: Secondary | ICD-10-CM | POA: Diagnosis not present

## 2016-03-30 DIAGNOSIS — C569 Malignant neoplasm of unspecified ovary: Secondary | ICD-10-CM | POA: Diagnosis not present

## 2016-03-30 DIAGNOSIS — R590 Localized enlarged lymph nodes: Secondary | ICD-10-CM | POA: Diagnosis not present

## 2016-05-02 DIAGNOSIS — Z5111 Encounter for antineoplastic chemotherapy: Secondary | ICD-10-CM | POA: Diagnosis not present

## 2016-05-02 DIAGNOSIS — Z23 Encounter for immunization: Secondary | ICD-10-CM | POA: Diagnosis not present

## 2016-05-09 DIAGNOSIS — Z5111 Encounter for antineoplastic chemotherapy: Secondary | ICD-10-CM | POA: Diagnosis not present

## 2016-05-09 DIAGNOSIS — C569 Malignant neoplasm of unspecified ovary: Secondary | ICD-10-CM | POA: Diagnosis not present

## 2016-06-12 DIAGNOSIS — R911 Solitary pulmonary nodule: Secondary | ICD-10-CM | POA: Diagnosis not present

## 2016-06-12 DIAGNOSIS — C786 Secondary malignant neoplasm of retroperitoneum and peritoneum: Secondary | ICD-10-CM | POA: Diagnosis not present

## 2016-06-12 DIAGNOSIS — C787 Secondary malignant neoplasm of liver and intrahepatic bile duct: Secondary | ICD-10-CM | POA: Diagnosis not present

## 2016-06-13 ENCOUNTER — Ambulatory Visit: Payer: PPO | Admitting: Podiatry

## 2016-06-13 DIAGNOSIS — M810 Age-related osteoporosis without current pathological fracture: Secondary | ICD-10-CM | POA: Diagnosis not present

## 2016-06-13 DIAGNOSIS — L270 Generalized skin eruption due to drugs and medicaments taken internally: Secondary | ICD-10-CM | POA: Diagnosis not present

## 2016-06-13 DIAGNOSIS — Z79899 Other long term (current) drug therapy: Secondary | ICD-10-CM | POA: Diagnosis not present

## 2016-06-13 DIAGNOSIS — C569 Malignant neoplasm of unspecified ovary: Secondary | ICD-10-CM | POA: Diagnosis not present

## 2016-06-13 DIAGNOSIS — M7989 Other specified soft tissue disorders: Secondary | ICD-10-CM | POA: Diagnosis not present

## 2016-06-13 DIAGNOSIS — R269 Unspecified abnormalities of gait and mobility: Secondary | ICD-10-CM | POA: Diagnosis not present

## 2016-06-13 DIAGNOSIS — R911 Solitary pulmonary nodule: Secondary | ICD-10-CM | POA: Diagnosis not present

## 2016-06-13 DIAGNOSIS — E039 Hypothyroidism, unspecified: Secondary | ICD-10-CM | POA: Diagnosis not present

## 2016-06-13 DIAGNOSIS — J45909 Unspecified asthma, uncomplicated: Secondary | ICD-10-CM | POA: Diagnosis not present

## 2016-06-13 DIAGNOSIS — Z5111 Encounter for antineoplastic chemotherapy: Secondary | ICD-10-CM | POA: Diagnosis not present

## 2016-06-13 DIAGNOSIS — R0989 Other specified symptoms and signs involving the circulatory and respiratory systems: Secondary | ICD-10-CM | POA: Diagnosis not present

## 2016-06-13 DIAGNOSIS — R600 Localized edema: Secondary | ICD-10-CM | POA: Diagnosis not present

## 2016-06-13 DIAGNOSIS — I10 Essential (primary) hypertension: Secondary | ICD-10-CM | POA: Diagnosis not present

## 2016-06-13 DIAGNOSIS — Z9221 Personal history of antineoplastic chemotherapy: Secondary | ICD-10-CM | POA: Diagnosis not present

## 2016-06-13 DIAGNOSIS — G629 Polyneuropathy, unspecified: Secondary | ICD-10-CM | POA: Diagnosis not present

## 2016-06-13 DIAGNOSIS — Z9889 Other specified postprocedural states: Secondary | ICD-10-CM | POA: Diagnosis not present

## 2016-06-13 DIAGNOSIS — Z8543 Personal history of malignant neoplasm of ovary: Secondary | ICD-10-CM | POA: Diagnosis not present

## 2016-06-13 DIAGNOSIS — R42 Dizziness and giddiness: Secondary | ICD-10-CM | POA: Diagnosis not present

## 2016-06-13 DIAGNOSIS — R278 Other lack of coordination: Secondary | ICD-10-CM | POA: Diagnosis not present

## 2016-06-16 DIAGNOSIS — I6523 Occlusion and stenosis of bilateral carotid arteries: Secondary | ICD-10-CM | POA: Diagnosis not present

## 2016-06-16 DIAGNOSIS — R42 Dizziness and giddiness: Secondary | ICD-10-CM | POA: Diagnosis not present

## 2016-06-16 DIAGNOSIS — R269 Unspecified abnormalities of gait and mobility: Secondary | ICD-10-CM | POA: Diagnosis not present

## 2016-06-16 DIAGNOSIS — C569 Malignant neoplasm of unspecified ovary: Secondary | ICD-10-CM | POA: Diagnosis not present

## 2016-06-16 DIAGNOSIS — R0989 Other specified symptoms and signs involving the circulatory and respiratory systems: Secondary | ICD-10-CM | POA: Diagnosis not present

## 2016-06-20 ENCOUNTER — Ambulatory Visit: Payer: PPO | Admitting: Podiatry

## 2016-06-20 DIAGNOSIS — F410 Panic disorder [episodic paroxysmal anxiety] without agoraphobia: Secondary | ICD-10-CM | POA: Diagnosis not present

## 2016-06-20 DIAGNOSIS — C569 Malignant neoplasm of unspecified ovary: Secondary | ICD-10-CM | POA: Diagnosis not present

## 2016-06-20 DIAGNOSIS — R21 Rash and other nonspecific skin eruption: Secondary | ICD-10-CM | POA: Diagnosis not present

## 2016-06-20 DIAGNOSIS — L270 Generalized skin eruption due to drugs and medicaments taken internally: Secondary | ICD-10-CM | POA: Diagnosis not present

## 2016-06-20 DIAGNOSIS — R42 Dizziness and giddiness: Secondary | ICD-10-CM | POA: Diagnosis not present

## 2016-06-20 DIAGNOSIS — G620 Drug-induced polyneuropathy: Secondary | ICD-10-CM | POA: Diagnosis not present

## 2016-06-23 ENCOUNTER — Ambulatory Visit: Payer: PPO | Admitting: Podiatry

## 2016-06-23 DIAGNOSIS — G629 Polyneuropathy, unspecified: Secondary | ICD-10-CM | POA: Diagnosis not present

## 2016-06-26 DIAGNOSIS — R269 Unspecified abnormalities of gait and mobility: Secondary | ICD-10-CM | POA: Diagnosis not present

## 2016-06-26 DIAGNOSIS — R270 Ataxia, unspecified: Secondary | ICD-10-CM | POA: Diagnosis not present

## 2016-06-27 DIAGNOSIS — C569 Malignant neoplasm of unspecified ovary: Secondary | ICD-10-CM | POA: Diagnosis not present

## 2016-06-27 DIAGNOSIS — R42 Dizziness and giddiness: Secondary | ICD-10-CM | POA: Diagnosis not present

## 2016-08-03 DIAGNOSIS — J069 Acute upper respiratory infection, unspecified: Secondary | ICD-10-CM | POA: Diagnosis not present

## 2016-08-03 DIAGNOSIS — C569 Malignant neoplasm of unspecified ovary: Secondary | ICD-10-CM | POA: Diagnosis not present

## 2016-08-03 DIAGNOSIS — I10 Essential (primary) hypertension: Secondary | ICD-10-CM | POA: Diagnosis not present

## 2016-08-03 DIAGNOSIS — J40 Bronchitis, not specified as acute or chronic: Secondary | ICD-10-CM | POA: Diagnosis not present

## 2016-08-25 DIAGNOSIS — C569 Malignant neoplasm of unspecified ovary: Secondary | ICD-10-CM | POA: Diagnosis not present

## 2016-08-25 DIAGNOSIS — K769 Liver disease, unspecified: Secondary | ICD-10-CM | POA: Diagnosis not present

## 2016-08-25 DIAGNOSIS — R911 Solitary pulmonary nodule: Secondary | ICD-10-CM | POA: Diagnosis not present

## 2016-10-03 DIAGNOSIS — Z5111 Encounter for antineoplastic chemotherapy: Secondary | ICD-10-CM | POA: Diagnosis not present

## 2016-10-03 DIAGNOSIS — C569 Malignant neoplasm of unspecified ovary: Secondary | ICD-10-CM | POA: Diagnosis not present

## 2016-10-03 DIAGNOSIS — R600 Localized edema: Secondary | ICD-10-CM | POA: Diagnosis not present

## 2016-10-10 DIAGNOSIS — C569 Malignant neoplasm of unspecified ovary: Secondary | ICD-10-CM | POA: Diagnosis not present

## 2016-10-10 DIAGNOSIS — Z5111 Encounter for antineoplastic chemotherapy: Secondary | ICD-10-CM | POA: Diagnosis not present

## 2016-10-16 DIAGNOSIS — G620 Drug-induced polyneuropathy: Secondary | ICD-10-CM | POA: Diagnosis not present

## 2016-10-16 DIAGNOSIS — I10 Essential (primary) hypertension: Secondary | ICD-10-CM | POA: Diagnosis not present

## 2016-10-16 DIAGNOSIS — Z780 Asymptomatic menopausal state: Secondary | ICD-10-CM | POA: Diagnosis not present

## 2016-10-16 DIAGNOSIS — E034 Atrophy of thyroid (acquired): Secondary | ICD-10-CM | POA: Diagnosis not present

## 2016-10-16 DIAGNOSIS — M150 Primary generalized (osteo)arthritis: Secondary | ICD-10-CM | POA: Diagnosis not present

## 2016-10-16 DIAGNOSIS — C569 Malignant neoplasm of unspecified ovary: Secondary | ICD-10-CM | POA: Diagnosis not present

## 2016-10-16 DIAGNOSIS — J4520 Mild intermittent asthma, uncomplicated: Secondary | ICD-10-CM | POA: Diagnosis not present

## 2016-10-16 DIAGNOSIS — Z0000 Encounter for general adult medical examination without abnormal findings: Secondary | ICD-10-CM | POA: Diagnosis not present

## 2016-10-16 DIAGNOSIS — M810 Age-related osteoporosis without current pathological fracture: Secondary | ICD-10-CM | POA: Diagnosis not present

## 2016-10-30 DIAGNOSIS — M810 Age-related osteoporosis without current pathological fracture: Secondary | ICD-10-CM | POA: Diagnosis not present

## 2016-11-21 DIAGNOSIS — C787 Secondary malignant neoplasm of liver and intrahepatic bile duct: Secondary | ICD-10-CM | POA: Diagnosis not present

## 2016-11-21 DIAGNOSIS — G629 Polyneuropathy, unspecified: Secondary | ICD-10-CM | POA: Diagnosis not present

## 2016-11-21 DIAGNOSIS — C561 Malignant neoplasm of right ovary: Secondary | ICD-10-CM | POA: Diagnosis not present

## 2016-11-21 DIAGNOSIS — R5383 Other fatigue: Secondary | ICD-10-CM | POA: Diagnosis not present

## 2016-11-21 DIAGNOSIS — Z5112 Encounter for antineoplastic immunotherapy: Secondary | ICD-10-CM | POA: Diagnosis not present

## 2016-11-21 DIAGNOSIS — R42 Dizziness and giddiness: Secondary | ICD-10-CM | POA: Diagnosis not present

## 2016-11-21 DIAGNOSIS — R238 Other skin changes: Secondary | ICD-10-CM | POA: Diagnosis not present

## 2016-11-21 DIAGNOSIS — L608 Other nail disorders: Secondary | ICD-10-CM | POA: Diagnosis not present

## 2016-11-21 DIAGNOSIS — E0590 Thyrotoxicosis, unspecified without thyrotoxic crisis or storm: Secondary | ICD-10-CM | POA: Diagnosis not present

## 2016-11-21 DIAGNOSIS — C562 Malignant neoplasm of left ovary: Secondary | ICD-10-CM | POA: Diagnosis not present

## 2016-11-21 DIAGNOSIS — Z90722 Acquired absence of ovaries, bilateral: Secondary | ICD-10-CM | POA: Diagnosis not present

## 2016-12-12 DIAGNOSIS — Z5111 Encounter for antineoplastic chemotherapy: Secondary | ICD-10-CM | POA: Diagnosis not present

## 2016-12-12 DIAGNOSIS — Z5112 Encounter for antineoplastic immunotherapy: Secondary | ICD-10-CM | POA: Diagnosis not present

## 2016-12-12 DIAGNOSIS — C569 Malignant neoplasm of unspecified ovary: Secondary | ICD-10-CM | POA: Diagnosis not present

## 2017-01-01 DIAGNOSIS — C787 Secondary malignant neoplasm of liver and intrahepatic bile duct: Secondary | ICD-10-CM | POA: Diagnosis not present

## 2017-01-01 DIAGNOSIS — R911 Solitary pulmonary nodule: Secondary | ICD-10-CM | POA: Diagnosis not present

## 2017-01-01 DIAGNOSIS — C569 Malignant neoplasm of unspecified ovary: Secondary | ICD-10-CM | POA: Diagnosis not present

## 2017-01-23 DIAGNOSIS — Z5111 Encounter for antineoplastic chemotherapy: Secondary | ICD-10-CM | POA: Diagnosis not present

## 2017-03-06 DIAGNOSIS — C569 Malignant neoplasm of unspecified ovary: Secondary | ICD-10-CM | POA: Diagnosis not present

## 2017-03-06 DIAGNOSIS — C787 Secondary malignant neoplasm of liver and intrahepatic bile duct: Secondary | ICD-10-CM | POA: Diagnosis not present

## 2017-03-27 DIAGNOSIS — Z9114 Patient's other noncompliance with medication regimen: Secondary | ICD-10-CM | POA: Diagnosis not present

## 2017-03-27 DIAGNOSIS — Z5112 Encounter for antineoplastic immunotherapy: Secondary | ICD-10-CM | POA: Diagnosis not present

## 2017-03-27 DIAGNOSIS — M25539 Pain in unspecified wrist: Secondary | ICD-10-CM | POA: Diagnosis not present

## 2017-03-27 DIAGNOSIS — C787 Secondary malignant neoplasm of liver and intrahepatic bile duct: Secondary | ICD-10-CM | POA: Diagnosis not present

## 2017-03-27 DIAGNOSIS — C561 Malignant neoplasm of right ovary: Secondary | ICD-10-CM | POA: Diagnosis not present

## 2017-03-27 DIAGNOSIS — Z79899 Other long term (current) drug therapy: Secondary | ICD-10-CM | POA: Diagnosis not present

## 2017-03-27 DIAGNOSIS — Z9081 Acquired absence of spleen: Secondary | ICD-10-CM | POA: Diagnosis not present

## 2017-03-27 DIAGNOSIS — G629 Polyneuropathy, unspecified: Secondary | ICD-10-CM | POA: Diagnosis not present

## 2017-03-27 DIAGNOSIS — C786 Secondary malignant neoplasm of retroperitoneum and peritoneum: Secondary | ICD-10-CM | POA: Diagnosis not present

## 2017-03-27 DIAGNOSIS — C569 Malignant neoplasm of unspecified ovary: Secondary | ICD-10-CM | POA: Diagnosis not present

## 2017-03-27 DIAGNOSIS — Z90710 Acquired absence of both cervix and uterus: Secondary | ICD-10-CM | POA: Diagnosis not present

## 2017-03-27 DIAGNOSIS — I10 Essential (primary) hypertension: Secondary | ICD-10-CM | POA: Diagnosis not present

## 2017-03-27 DIAGNOSIS — R600 Localized edema: Secondary | ICD-10-CM | POA: Diagnosis not present

## 2017-03-27 DIAGNOSIS — Z9049 Acquired absence of other specified parts of digestive tract: Secondary | ICD-10-CM | POA: Diagnosis not present

## 2017-03-27 DIAGNOSIS — C562 Malignant neoplasm of left ovary: Secondary | ICD-10-CM | POA: Diagnosis not present

## 2017-03-27 DIAGNOSIS — Z90722 Acquired absence of ovaries, bilateral: Secondary | ICD-10-CM | POA: Diagnosis not present

## 2017-04-11 DIAGNOSIS — G620 Drug-induced polyneuropathy: Secondary | ICD-10-CM | POA: Diagnosis not present

## 2017-04-11 DIAGNOSIS — M810 Age-related osteoporosis without current pathological fracture: Secondary | ICD-10-CM | POA: Diagnosis not present

## 2017-04-11 DIAGNOSIS — C569 Malignant neoplasm of unspecified ovary: Secondary | ICD-10-CM | POA: Diagnosis not present

## 2017-04-11 DIAGNOSIS — E034 Atrophy of thyroid (acquired): Secondary | ICD-10-CM | POA: Diagnosis not present

## 2017-04-11 DIAGNOSIS — I10 Essential (primary) hypertension: Secondary | ICD-10-CM | POA: Diagnosis not present

## 2017-04-18 DIAGNOSIS — M150 Primary generalized (osteo)arthritis: Secondary | ICD-10-CM | POA: Diagnosis not present

## 2017-04-18 DIAGNOSIS — I10 Essential (primary) hypertension: Secondary | ICD-10-CM | POA: Diagnosis not present

## 2017-04-18 DIAGNOSIS — M810 Age-related osteoporosis without current pathological fracture: Secondary | ICD-10-CM | POA: Diagnosis not present

## 2017-04-18 DIAGNOSIS — E034 Atrophy of thyroid (acquired): Secondary | ICD-10-CM | POA: Diagnosis not present

## 2017-04-18 DIAGNOSIS — J4520 Mild intermittent asthma, uncomplicated: Secondary | ICD-10-CM | POA: Diagnosis not present

## 2017-04-18 DIAGNOSIS — G620 Drug-induced polyneuropathy: Secondary | ICD-10-CM | POA: Diagnosis not present

## 2017-04-18 DIAGNOSIS — Z23 Encounter for immunization: Secondary | ICD-10-CM | POA: Diagnosis not present

## 2017-04-18 DIAGNOSIS — R911 Solitary pulmonary nodule: Secondary | ICD-10-CM | POA: Diagnosis not present

## 2017-04-18 DIAGNOSIS — C569 Malignant neoplasm of unspecified ovary: Secondary | ICD-10-CM | POA: Diagnosis not present

## 2017-05-08 DIAGNOSIS — C787 Secondary malignant neoplasm of liver and intrahepatic bile duct: Secondary | ICD-10-CM | POA: Diagnosis not present

## 2017-05-08 DIAGNOSIS — C569 Malignant neoplasm of unspecified ovary: Secondary | ICD-10-CM | POA: Diagnosis not present

## 2017-05-08 DIAGNOSIS — R911 Solitary pulmonary nodule: Secondary | ICD-10-CM | POA: Diagnosis not present

## 2017-05-29 DIAGNOSIS — Z5112 Encounter for antineoplastic immunotherapy: Secondary | ICD-10-CM | POA: Diagnosis not present

## 2017-05-29 DIAGNOSIS — Z90710 Acquired absence of both cervix and uterus: Secondary | ICD-10-CM | POA: Diagnosis not present

## 2017-05-29 DIAGNOSIS — Z90722 Acquired absence of ovaries, bilateral: Secondary | ICD-10-CM | POA: Diagnosis not present

## 2017-05-29 DIAGNOSIS — C787 Secondary malignant neoplasm of liver and intrahepatic bile duct: Secondary | ICD-10-CM | POA: Diagnosis not present

## 2017-05-29 DIAGNOSIS — G629 Polyneuropathy, unspecified: Secondary | ICD-10-CM | POA: Diagnosis not present

## 2017-05-29 DIAGNOSIS — Z9889 Other specified postprocedural states: Secondary | ICD-10-CM | POA: Diagnosis not present

## 2017-05-29 DIAGNOSIS — C562 Malignant neoplasm of left ovary: Secondary | ICD-10-CM | POA: Diagnosis not present

## 2017-05-29 DIAGNOSIS — C561 Malignant neoplasm of right ovary: Secondary | ICD-10-CM | POA: Diagnosis not present

## 2017-05-29 DIAGNOSIS — R42 Dizziness and giddiness: Secondary | ICD-10-CM | POA: Diagnosis not present

## 2017-05-29 DIAGNOSIS — C786 Secondary malignant neoplasm of retroperitoneum and peritoneum: Secondary | ICD-10-CM | POA: Diagnosis not present

## 2017-05-29 DIAGNOSIS — R5383 Other fatigue: Secondary | ICD-10-CM | POA: Diagnosis not present

## 2017-07-12 DIAGNOSIS — K769 Liver disease, unspecified: Secondary | ICD-10-CM | POA: Diagnosis not present

## 2017-07-12 DIAGNOSIS — I10 Essential (primary) hypertension: Secondary | ICD-10-CM | POA: Diagnosis not present

## 2017-07-12 DIAGNOSIS — Z5111 Encounter for antineoplastic chemotherapy: Secondary | ICD-10-CM | POA: Diagnosis not present

## 2017-07-12 DIAGNOSIS — Z5112 Encounter for antineoplastic immunotherapy: Secondary | ICD-10-CM | POA: Diagnosis not present

## 2017-07-12 DIAGNOSIS — R911 Solitary pulmonary nodule: Secondary | ICD-10-CM | POA: Diagnosis not present

## 2017-07-12 DIAGNOSIS — C569 Malignant neoplasm of unspecified ovary: Secondary | ICD-10-CM | POA: Diagnosis not present

## 2017-07-24 DIAGNOSIS — Z5111 Encounter for antineoplastic chemotherapy: Secondary | ICD-10-CM | POA: Diagnosis not present

## 2017-07-24 DIAGNOSIS — C786 Secondary malignant neoplasm of retroperitoneum and peritoneum: Secondary | ICD-10-CM | POA: Diagnosis not present

## 2017-07-24 DIAGNOSIS — Z9079 Acquired absence of other genital organ(s): Secondary | ICD-10-CM | POA: Diagnosis not present

## 2017-07-24 DIAGNOSIS — Z9081 Acquired absence of spleen: Secondary | ICD-10-CM | POA: Diagnosis not present

## 2017-07-24 DIAGNOSIS — Z90710 Acquired absence of both cervix and uterus: Secondary | ICD-10-CM | POA: Diagnosis not present

## 2017-07-24 DIAGNOSIS — C562 Malignant neoplasm of left ovary: Secondary | ICD-10-CM | POA: Diagnosis not present

## 2017-07-24 DIAGNOSIS — Z9049 Acquired absence of other specified parts of digestive tract: Secondary | ICD-10-CM | POA: Diagnosis not present

## 2017-07-24 DIAGNOSIS — C569 Malignant neoplasm of unspecified ovary: Secondary | ICD-10-CM | POA: Diagnosis not present

## 2017-07-24 DIAGNOSIS — C787 Secondary malignant neoplasm of liver and intrahepatic bile duct: Secondary | ICD-10-CM | POA: Diagnosis not present

## 2017-07-24 DIAGNOSIS — Z5112 Encounter for antineoplastic immunotherapy: Secondary | ICD-10-CM | POA: Diagnosis not present

## 2017-07-24 DIAGNOSIS — G620 Drug-induced polyneuropathy: Secondary | ICD-10-CM | POA: Diagnosis not present

## 2017-07-24 DIAGNOSIS — T50905A Adverse effect of unspecified drugs, medicaments and biological substances, initial encounter: Secondary | ICD-10-CM | POA: Diagnosis not present

## 2017-07-24 DIAGNOSIS — G4700 Insomnia, unspecified: Secondary | ICD-10-CM | POA: Diagnosis not present

## 2017-07-24 DIAGNOSIS — Z9114 Patient's other noncompliance with medication regimen: Secondary | ICD-10-CM | POA: Diagnosis not present

## 2017-07-24 DIAGNOSIS — C561 Malignant neoplasm of right ovary: Secondary | ICD-10-CM | POA: Diagnosis not present

## 2017-07-24 DIAGNOSIS — I10 Essential (primary) hypertension: Secondary | ICD-10-CM | POA: Diagnosis not present

## 2017-07-24 DIAGNOSIS — Z90722 Acquired absence of ovaries, bilateral: Secondary | ICD-10-CM | POA: Diagnosis not present

## 2017-09-04 DIAGNOSIS — H538 Other visual disturbances: Secondary | ICD-10-CM | POA: Diagnosis not present

## 2017-09-04 DIAGNOSIS — Z6827 Body mass index (BMI) 27.0-27.9, adult: Secondary | ICD-10-CM | POA: Diagnosis not present

## 2017-09-04 DIAGNOSIS — C787 Secondary malignant neoplasm of liver and intrahepatic bile duct: Secondary | ICD-10-CM | POA: Diagnosis not present

## 2017-09-04 DIAGNOSIS — Z90722 Acquired absence of ovaries, bilateral: Secondary | ICD-10-CM | POA: Diagnosis not present

## 2017-09-04 DIAGNOSIS — C561 Malignant neoplasm of right ovary: Secondary | ICD-10-CM | POA: Diagnosis not present

## 2017-09-04 DIAGNOSIS — C541 Malignant neoplasm of endometrium: Secondary | ICD-10-CM | POA: Diagnosis not present

## 2017-09-04 DIAGNOSIS — G629 Polyneuropathy, unspecified: Secondary | ICD-10-CM | POA: Diagnosis not present

## 2017-09-04 DIAGNOSIS — G4700 Insomnia, unspecified: Secondary | ICD-10-CM | POA: Diagnosis not present

## 2017-09-04 DIAGNOSIS — I10 Essential (primary) hypertension: Secondary | ICD-10-CM | POA: Diagnosis not present

## 2017-09-04 DIAGNOSIS — M7989 Other specified soft tissue disorders: Secondary | ICD-10-CM | POA: Diagnosis not present

## 2017-09-04 DIAGNOSIS — R140 Abdominal distension (gaseous): Secondary | ICD-10-CM | POA: Diagnosis not present

## 2017-09-04 DIAGNOSIS — R42 Dizziness and giddiness: Secondary | ICD-10-CM | POA: Diagnosis not present

## 2017-09-04 DIAGNOSIS — R635 Abnormal weight gain: Secondary | ICD-10-CM | POA: Diagnosis not present

## 2017-09-04 DIAGNOSIS — C569 Malignant neoplasm of unspecified ovary: Secondary | ICD-10-CM | POA: Diagnosis not present

## 2017-09-04 DIAGNOSIS — Z90710 Acquired absence of both cervix and uterus: Secondary | ICD-10-CM | POA: Diagnosis not present

## 2017-09-04 DIAGNOSIS — C562 Malignant neoplasm of left ovary: Secondary | ICD-10-CM | POA: Diagnosis not present

## 2017-09-04 DIAGNOSIS — Z9889 Other specified postprocedural states: Secondary | ICD-10-CM | POA: Diagnosis not present

## 2017-09-04 DIAGNOSIS — R51 Headache: Secondary | ICD-10-CM | POA: Diagnosis not present

## 2017-09-04 DIAGNOSIS — C786 Secondary malignant neoplasm of retroperitoneum and peritoneum: Secondary | ICD-10-CM | POA: Diagnosis not present

## 2017-10-11 DIAGNOSIS — M150 Primary generalized (osteo)arthritis: Secondary | ICD-10-CM | POA: Diagnosis not present

## 2017-10-11 DIAGNOSIS — E034 Atrophy of thyroid (acquired): Secondary | ICD-10-CM | POA: Diagnosis not present

## 2017-10-11 DIAGNOSIS — I10 Essential (primary) hypertension: Secondary | ICD-10-CM | POA: Diagnosis not present

## 2017-10-11 DIAGNOSIS — M810 Age-related osteoporosis without current pathological fracture: Secondary | ICD-10-CM | POA: Diagnosis not present

## 2017-10-16 DIAGNOSIS — M7989 Other specified soft tissue disorders: Secondary | ICD-10-CM | POA: Diagnosis not present

## 2017-10-16 DIAGNOSIS — Z9889 Other specified postprocedural states: Secondary | ICD-10-CM | POA: Diagnosis not present

## 2017-10-16 DIAGNOSIS — R42 Dizziness and giddiness: Secondary | ICD-10-CM | POA: Diagnosis not present

## 2017-10-16 DIAGNOSIS — C562 Malignant neoplasm of left ovary: Secondary | ICD-10-CM | POA: Diagnosis not present

## 2017-10-16 DIAGNOSIS — R232 Flushing: Secondary | ICD-10-CM | POA: Diagnosis not present

## 2017-10-16 DIAGNOSIS — Z90722 Acquired absence of ovaries, bilateral: Secondary | ICD-10-CM | POA: Diagnosis not present

## 2017-10-16 DIAGNOSIS — K769 Liver disease, unspecified: Secondary | ICD-10-CM | POA: Diagnosis not present

## 2017-10-16 DIAGNOSIS — C569 Malignant neoplasm of unspecified ovary: Secondary | ICD-10-CM | POA: Diagnosis not present

## 2017-10-16 DIAGNOSIS — I10 Essential (primary) hypertension: Secondary | ICD-10-CM | POA: Diagnosis not present

## 2017-10-16 DIAGNOSIS — C787 Secondary malignant neoplasm of liver and intrahepatic bile duct: Secondary | ICD-10-CM | POA: Diagnosis not present

## 2017-10-16 DIAGNOSIS — G4700 Insomnia, unspecified: Secondary | ICD-10-CM | POA: Diagnosis not present

## 2017-10-16 DIAGNOSIS — Z90710 Acquired absence of both cervix and uterus: Secondary | ICD-10-CM | POA: Diagnosis not present

## 2017-10-16 DIAGNOSIS — R911 Solitary pulmonary nodule: Secondary | ICD-10-CM | POA: Diagnosis not present

## 2017-10-16 DIAGNOSIS — R51 Headache: Secondary | ICD-10-CM | POA: Diagnosis not present

## 2017-10-16 DIAGNOSIS — R140 Abdominal distension (gaseous): Secondary | ICD-10-CM | POA: Diagnosis not present

## 2017-10-16 DIAGNOSIS — C7982 Secondary malignant neoplasm of genital organs: Secondary | ICD-10-CM | POA: Diagnosis not present

## 2017-10-16 DIAGNOSIS — R918 Other nonspecific abnormal finding of lung field: Secondary | ICD-10-CM | POA: Diagnosis not present

## 2017-10-16 DIAGNOSIS — C561 Malignant neoplasm of right ovary: Secondary | ICD-10-CM | POA: Diagnosis not present

## 2017-10-16 DIAGNOSIS — G629 Polyneuropathy, unspecified: Secondary | ICD-10-CM | POA: Diagnosis not present

## 2017-10-17 DIAGNOSIS — J4520 Mild intermittent asthma, uncomplicated: Secondary | ICD-10-CM | POA: Diagnosis not present

## 2017-10-17 DIAGNOSIS — Z0000 Encounter for general adult medical examination without abnormal findings: Secondary | ICD-10-CM | POA: Diagnosis not present

## 2017-10-17 DIAGNOSIS — G620 Drug-induced polyneuropathy: Secondary | ICD-10-CM | POA: Diagnosis not present

## 2017-10-17 DIAGNOSIS — M150 Primary generalized (osteo)arthritis: Secondary | ICD-10-CM | POA: Diagnosis not present

## 2017-10-17 DIAGNOSIS — M810 Age-related osteoporosis without current pathological fracture: Secondary | ICD-10-CM | POA: Diagnosis not present

## 2017-10-17 DIAGNOSIS — E034 Atrophy of thyroid (acquired): Secondary | ICD-10-CM | POA: Diagnosis not present

## 2017-10-17 DIAGNOSIS — I10 Essential (primary) hypertension: Secondary | ICD-10-CM | POA: Diagnosis not present

## 2017-10-17 DIAGNOSIS — Z23 Encounter for immunization: Secondary | ICD-10-CM | POA: Diagnosis not present

## 2017-10-17 DIAGNOSIS — R911 Solitary pulmonary nodule: Secondary | ICD-10-CM | POA: Diagnosis not present

## 2017-10-17 DIAGNOSIS — C569 Malignant neoplasm of unspecified ovary: Secondary | ICD-10-CM | POA: Diagnosis not present

## 2017-11-20 DIAGNOSIS — J4520 Mild intermittent asthma, uncomplicated: Secondary | ICD-10-CM | POA: Diagnosis not present

## 2017-11-20 DIAGNOSIS — M810 Age-related osteoporosis without current pathological fracture: Secondary | ICD-10-CM | POA: Diagnosis not present

## 2017-11-20 DIAGNOSIS — G620 Drug-induced polyneuropathy: Secondary | ICD-10-CM | POA: Diagnosis not present

## 2017-11-20 DIAGNOSIS — I10 Essential (primary) hypertension: Secondary | ICD-10-CM | POA: Diagnosis not present

## 2017-11-20 DIAGNOSIS — C569 Malignant neoplasm of unspecified ovary: Secondary | ICD-10-CM | POA: Diagnosis not present

## 2017-11-20 DIAGNOSIS — E034 Atrophy of thyroid (acquired): Secondary | ICD-10-CM | POA: Diagnosis not present

## 2017-11-20 DIAGNOSIS — M150 Primary generalized (osteo)arthritis: Secondary | ICD-10-CM | POA: Diagnosis not present

## 2018-01-15 DIAGNOSIS — Z9889 Other specified postprocedural states: Secondary | ICD-10-CM | POA: Diagnosis not present

## 2018-01-15 DIAGNOSIS — C561 Malignant neoplasm of right ovary: Secondary | ICD-10-CM | POA: Diagnosis not present

## 2018-01-15 DIAGNOSIS — Z8543 Personal history of malignant neoplasm of ovary: Secondary | ICD-10-CM | POA: Diagnosis not present

## 2018-01-15 DIAGNOSIS — C786 Secondary malignant neoplasm of retroperitoneum and peritoneum: Secondary | ICD-10-CM | POA: Diagnosis not present

## 2018-01-15 DIAGNOSIS — R911 Solitary pulmonary nodule: Secondary | ICD-10-CM | POA: Diagnosis not present

## 2018-01-15 DIAGNOSIS — C787 Secondary malignant neoplasm of liver and intrahepatic bile duct: Secondary | ICD-10-CM | POA: Diagnosis not present

## 2018-01-15 DIAGNOSIS — C562 Malignant neoplasm of left ovary: Secondary | ICD-10-CM | POA: Diagnosis not present

## 2018-01-15 DIAGNOSIS — K769 Liver disease, unspecified: Secondary | ICD-10-CM | POA: Diagnosis not present

## 2018-01-15 DIAGNOSIS — R109 Unspecified abdominal pain: Secondary | ICD-10-CM | POA: Diagnosis not present

## 2018-01-15 DIAGNOSIS — I10 Essential (primary) hypertension: Secondary | ICD-10-CM | POA: Diagnosis not present

## 2018-01-15 DIAGNOSIS — R232 Flushing: Secondary | ICD-10-CM | POA: Diagnosis not present

## 2018-01-15 DIAGNOSIS — Z90722 Acquired absence of ovaries, bilateral: Secondary | ICD-10-CM | POA: Diagnosis not present

## 2018-01-15 DIAGNOSIS — C541 Malignant neoplasm of endometrium: Secondary | ICD-10-CM | POA: Diagnosis not present

## 2018-01-15 DIAGNOSIS — Z9221 Personal history of antineoplastic chemotherapy: Secondary | ICD-10-CM | POA: Diagnosis not present

## 2018-01-15 DIAGNOSIS — J45909 Unspecified asthma, uncomplicated: Secondary | ICD-10-CM | POA: Diagnosis not present

## 2018-01-15 DIAGNOSIS — G629 Polyneuropathy, unspecified: Secondary | ICD-10-CM | POA: Diagnosis not present

## 2018-01-15 DIAGNOSIS — Z90710 Acquired absence of both cervix and uterus: Secondary | ICD-10-CM | POA: Diagnosis not present

## 2018-01-15 DIAGNOSIS — C569 Malignant neoplasm of unspecified ovary: Secondary | ICD-10-CM | POA: Diagnosis not present

## 2018-02-07 DIAGNOSIS — M25531 Pain in right wrist: Secondary | ICD-10-CM | POA: Diagnosis not present

## 2018-02-07 DIAGNOSIS — M150 Primary generalized (osteo)arthritis: Secondary | ICD-10-CM | POA: Diagnosis not present

## 2018-02-07 DIAGNOSIS — M19032 Primary osteoarthritis, left wrist: Secondary | ICD-10-CM | POA: Diagnosis not present

## 2018-04-16 DIAGNOSIS — Z5111 Encounter for antineoplastic chemotherapy: Secondary | ICD-10-CM | POA: Diagnosis not present

## 2018-04-16 DIAGNOSIS — M7989 Other specified soft tissue disorders: Secondary | ICD-10-CM | POA: Diagnosis not present

## 2018-04-16 DIAGNOSIS — Z79899 Other long term (current) drug therapy: Secondary | ICD-10-CM | POA: Diagnosis not present

## 2018-04-16 DIAGNOSIS — Z23 Encounter for immunization: Secondary | ICD-10-CM | POA: Diagnosis not present

## 2018-04-16 DIAGNOSIS — C569 Malignant neoplasm of unspecified ovary: Secondary | ICD-10-CM | POA: Diagnosis not present

## 2018-05-21 DIAGNOSIS — C569 Malignant neoplasm of unspecified ovary: Secondary | ICD-10-CM | POA: Diagnosis not present

## 2018-05-21 DIAGNOSIS — M150 Primary generalized (osteo)arthritis: Secondary | ICD-10-CM | POA: Diagnosis not present

## 2018-05-21 DIAGNOSIS — M810 Age-related osteoporosis without current pathological fracture: Secondary | ICD-10-CM | POA: Diagnosis not present

## 2018-05-21 DIAGNOSIS — I10 Essential (primary) hypertension: Secondary | ICD-10-CM | POA: Diagnosis not present

## 2018-05-21 DIAGNOSIS — E034 Atrophy of thyroid (acquired): Secondary | ICD-10-CM | POA: Diagnosis not present

## 2018-05-23 DIAGNOSIS — I10 Essential (primary) hypertension: Secondary | ICD-10-CM | POA: Diagnosis not present

## 2018-05-23 DIAGNOSIS — M810 Age-related osteoporosis without current pathological fracture: Secondary | ICD-10-CM | POA: Diagnosis not present

## 2018-05-23 DIAGNOSIS — G620 Drug-induced polyneuropathy: Secondary | ICD-10-CM | POA: Diagnosis not present

## 2018-05-23 DIAGNOSIS — E034 Atrophy of thyroid (acquired): Secondary | ICD-10-CM | POA: Diagnosis not present

## 2018-05-23 DIAGNOSIS — M150 Primary generalized (osteo)arthritis: Secondary | ICD-10-CM | POA: Diagnosis not present

## 2018-05-23 DIAGNOSIS — C569 Malignant neoplasm of unspecified ovary: Secondary | ICD-10-CM | POA: Diagnosis not present

## 2018-07-16 DIAGNOSIS — G620 Drug-induced polyneuropathy: Secondary | ICD-10-CM | POA: Diagnosis not present

## 2018-07-16 DIAGNOSIS — Z90710 Acquired absence of both cervix and uterus: Secondary | ICD-10-CM | POA: Diagnosis not present

## 2018-07-16 DIAGNOSIS — Z9081 Acquired absence of spleen: Secondary | ICD-10-CM | POA: Diagnosis not present

## 2018-07-16 DIAGNOSIS — C562 Malignant neoplasm of left ovary: Secondary | ICD-10-CM | POA: Diagnosis not present

## 2018-07-16 DIAGNOSIS — C569 Malignant neoplasm of unspecified ovary: Secondary | ICD-10-CM | POA: Diagnosis not present

## 2018-07-16 DIAGNOSIS — I10 Essential (primary) hypertension: Secondary | ICD-10-CM | POA: Diagnosis not present

## 2018-07-16 DIAGNOSIS — C787 Secondary malignant neoplasm of liver and intrahepatic bile duct: Secondary | ICD-10-CM | POA: Diagnosis not present

## 2018-07-16 DIAGNOSIS — C561 Malignant neoplasm of right ovary: Secondary | ICD-10-CM | POA: Diagnosis not present

## 2018-07-16 DIAGNOSIS — R232 Flushing: Secondary | ICD-10-CM | POA: Diagnosis not present

## 2018-07-16 DIAGNOSIS — M7989 Other specified soft tissue disorders: Secondary | ICD-10-CM | POA: Diagnosis not present

## 2018-07-16 DIAGNOSIS — Z79810 Long term (current) use of selective estrogen receptor modulators (SERMs): Secondary | ICD-10-CM | POA: Diagnosis not present

## 2018-07-16 DIAGNOSIS — R590 Localized enlarged lymph nodes: Secondary | ICD-10-CM | POA: Diagnosis not present

## 2018-07-16 DIAGNOSIS — Z5111 Encounter for antineoplastic chemotherapy: Secondary | ICD-10-CM | POA: Diagnosis not present

## 2018-07-16 DIAGNOSIS — Z9114 Patient's other noncompliance with medication regimen: Secondary | ICD-10-CM | POA: Diagnosis not present

## 2018-07-16 DIAGNOSIS — R911 Solitary pulmonary nodule: Secondary | ICD-10-CM | POA: Diagnosis not present

## 2018-07-16 DIAGNOSIS — Z90722 Acquired absence of ovaries, bilateral: Secondary | ICD-10-CM | POA: Diagnosis not present

## 2018-07-16 DIAGNOSIS — T451X5A Adverse effect of antineoplastic and immunosuppressive drugs, initial encounter: Secondary | ICD-10-CM | POA: Diagnosis not present

## 2018-07-16 DIAGNOSIS — Z79899 Other long term (current) drug therapy: Secondary | ICD-10-CM | POA: Diagnosis not present

## 2018-07-16 DIAGNOSIS — Z903 Acquired absence of stomach [part of]: Secondary | ICD-10-CM | POA: Diagnosis not present

## 2018-11-12 DIAGNOSIS — C569 Malignant neoplasm of unspecified ovary: Secondary | ICD-10-CM | POA: Diagnosis not present

## 2018-11-19 DIAGNOSIS — M810 Age-related osteoporosis without current pathological fracture: Secondary | ICD-10-CM | POA: Diagnosis not present

## 2018-11-19 DIAGNOSIS — G620 Drug-induced polyneuropathy: Secondary | ICD-10-CM | POA: Diagnosis not present

## 2018-11-19 DIAGNOSIS — I10 Essential (primary) hypertension: Secondary | ICD-10-CM | POA: Diagnosis not present

## 2018-11-19 DIAGNOSIS — E034 Atrophy of thyroid (acquired): Secondary | ICD-10-CM | POA: Diagnosis not present

## 2018-11-21 DIAGNOSIS — E034 Atrophy of thyroid (acquired): Secondary | ICD-10-CM | POA: Diagnosis not present

## 2018-11-21 DIAGNOSIS — J4520 Mild intermittent asthma, uncomplicated: Secondary | ICD-10-CM | POA: Diagnosis not present

## 2018-11-21 DIAGNOSIS — Z0000 Encounter for general adult medical examination without abnormal findings: Secondary | ICD-10-CM | POA: Diagnosis not present

## 2018-11-21 DIAGNOSIS — R911 Solitary pulmonary nodule: Secondary | ICD-10-CM | POA: Diagnosis not present

## 2018-11-21 DIAGNOSIS — M810 Age-related osteoporosis without current pathological fracture: Secondary | ICD-10-CM | POA: Diagnosis not present

## 2018-11-21 DIAGNOSIS — G620 Drug-induced polyneuropathy: Secondary | ICD-10-CM | POA: Diagnosis not present

## 2018-11-21 DIAGNOSIS — I10 Essential (primary) hypertension: Secondary | ICD-10-CM | POA: Diagnosis not present

## 2018-11-21 DIAGNOSIS — C569 Malignant neoplasm of unspecified ovary: Secondary | ICD-10-CM | POA: Diagnosis not present

## 2019-02-18 DIAGNOSIS — R600 Localized edema: Secondary | ICD-10-CM | POA: Diagnosis not present

## 2019-02-18 DIAGNOSIS — Z9889 Other specified postprocedural states: Secondary | ICD-10-CM | POA: Diagnosis not present

## 2019-02-18 DIAGNOSIS — Z5112 Encounter for antineoplastic immunotherapy: Secondary | ICD-10-CM | POA: Diagnosis not present

## 2019-02-18 DIAGNOSIS — G629 Polyneuropathy, unspecified: Secondary | ICD-10-CM | POA: Diagnosis not present

## 2019-02-18 DIAGNOSIS — C787 Secondary malignant neoplasm of liver and intrahepatic bile duct: Secondary | ICD-10-CM | POA: Diagnosis not present

## 2019-02-18 DIAGNOSIS — Z9221 Personal history of antineoplastic chemotherapy: Secondary | ICD-10-CM | POA: Diagnosis not present

## 2019-02-18 DIAGNOSIS — I10 Essential (primary) hypertension: Secondary | ICD-10-CM | POA: Diagnosis not present

## 2019-02-18 DIAGNOSIS — R590 Localized enlarged lymph nodes: Secondary | ICD-10-CM | POA: Diagnosis not present

## 2019-02-18 DIAGNOSIS — C569 Malignant neoplasm of unspecified ovary: Secondary | ICD-10-CM | POA: Diagnosis not present

## 2019-02-18 DIAGNOSIS — Z90722 Acquired absence of ovaries, bilateral: Secondary | ICD-10-CM | POA: Diagnosis not present

## 2019-02-18 DIAGNOSIS — Z9081 Acquired absence of spleen: Secondary | ICD-10-CM | POA: Diagnosis not present

## 2019-02-18 DIAGNOSIS — Z90710 Acquired absence of both cervix and uterus: Secondary | ICD-10-CM | POA: Diagnosis not present

## 2019-02-18 DIAGNOSIS — J45909 Unspecified asthma, uncomplicated: Secondary | ICD-10-CM | POA: Diagnosis not present

## 2019-02-18 DIAGNOSIS — Z5111 Encounter for antineoplastic chemotherapy: Secondary | ICD-10-CM | POA: Diagnosis not present

## 2019-02-20 DIAGNOSIS — R911 Solitary pulmonary nodule: Secondary | ICD-10-CM | POA: Diagnosis not present

## 2019-02-20 DIAGNOSIS — C569 Malignant neoplasm of unspecified ovary: Secondary | ICD-10-CM | POA: Diagnosis not present

## 2019-02-20 DIAGNOSIS — C786 Secondary malignant neoplasm of retroperitoneum and peritoneum: Secondary | ICD-10-CM | POA: Diagnosis not present

## 2019-03-11 DIAGNOSIS — Z5111 Encounter for antineoplastic chemotherapy: Secondary | ICD-10-CM | POA: Diagnosis not present

## 2019-03-11 DIAGNOSIS — C569 Malignant neoplasm of unspecified ovary: Secondary | ICD-10-CM | POA: Diagnosis not present

## 2019-03-25 DIAGNOSIS — C569 Malignant neoplasm of unspecified ovary: Secondary | ICD-10-CM | POA: Diagnosis not present

## 2019-03-25 DIAGNOSIS — Z5111 Encounter for antineoplastic chemotherapy: Secondary | ICD-10-CM | POA: Diagnosis not present

## 2019-04-08 DIAGNOSIS — C569 Malignant neoplasm of unspecified ovary: Secondary | ICD-10-CM | POA: Diagnosis not present

## 2019-04-08 DIAGNOSIS — G629 Polyneuropathy, unspecified: Secondary | ICD-10-CM | POA: Diagnosis not present

## 2019-04-08 DIAGNOSIS — Z90710 Acquired absence of both cervix and uterus: Secondary | ICD-10-CM | POA: Diagnosis not present

## 2019-04-08 DIAGNOSIS — J45909 Unspecified asthma, uncomplicated: Secondary | ICD-10-CM | POA: Diagnosis not present

## 2019-04-08 DIAGNOSIS — I10 Essential (primary) hypertension: Secondary | ICD-10-CM | POA: Diagnosis not present

## 2019-04-08 DIAGNOSIS — C787 Secondary malignant neoplasm of liver and intrahepatic bile duct: Secondary | ICD-10-CM | POA: Diagnosis not present

## 2019-04-08 DIAGNOSIS — R5383 Other fatigue: Secondary | ICD-10-CM | POA: Diagnosis not present

## 2019-04-08 DIAGNOSIS — C561 Malignant neoplasm of right ovary: Secondary | ICD-10-CM | POA: Diagnosis not present

## 2019-04-08 DIAGNOSIS — Z5112 Encounter for antineoplastic immunotherapy: Secondary | ICD-10-CM | POA: Diagnosis not present

## 2019-04-08 DIAGNOSIS — C786 Secondary malignant neoplasm of retroperitoneum and peritoneum: Secondary | ICD-10-CM | POA: Diagnosis not present

## 2019-04-08 DIAGNOSIS — Z5111 Encounter for antineoplastic chemotherapy: Secondary | ICD-10-CM | POA: Diagnosis not present

## 2019-04-08 DIAGNOSIS — Z90722 Acquired absence of ovaries, bilateral: Secondary | ICD-10-CM | POA: Diagnosis not present

## 2019-04-08 DIAGNOSIS — C774 Secondary and unspecified malignant neoplasm of inguinal and lower limb lymph nodes: Secondary | ICD-10-CM | POA: Diagnosis not present

## 2019-04-08 DIAGNOSIS — C562 Malignant neoplasm of left ovary: Secondary | ICD-10-CM | POA: Diagnosis not present

## 2019-04-08 DIAGNOSIS — Z903 Acquired absence of stomach [part of]: Secondary | ICD-10-CM | POA: Diagnosis not present

## 2019-04-08 DIAGNOSIS — Z23 Encounter for immunization: Secondary | ICD-10-CM | POA: Diagnosis not present

## 2019-04-08 DIAGNOSIS — R197 Diarrhea, unspecified: Secondary | ICD-10-CM | POA: Diagnosis not present

## 2019-04-08 DIAGNOSIS — Z79899 Other long term (current) drug therapy: Secondary | ICD-10-CM | POA: Diagnosis not present

## 2019-04-09 DIAGNOSIS — C569 Malignant neoplasm of unspecified ovary: Secondary | ICD-10-CM | POA: Diagnosis not present

## 2019-04-22 DIAGNOSIS — C569 Malignant neoplasm of unspecified ovary: Secondary | ICD-10-CM | POA: Diagnosis not present

## 2019-04-22 DIAGNOSIS — Z5111 Encounter for antineoplastic chemotherapy: Secondary | ICD-10-CM | POA: Diagnosis not present

## 2019-05-06 DIAGNOSIS — J45909 Unspecified asthma, uncomplicated: Secondary | ICD-10-CM | POA: Diagnosis not present

## 2019-05-06 DIAGNOSIS — Z5111 Encounter for antineoplastic chemotherapy: Secondary | ICD-10-CM | POA: Diagnosis not present

## 2019-05-06 DIAGNOSIS — G629 Polyneuropathy, unspecified: Secondary | ICD-10-CM | POA: Diagnosis not present

## 2019-05-06 DIAGNOSIS — C561 Malignant neoplasm of right ovary: Secondary | ICD-10-CM | POA: Diagnosis not present

## 2019-05-06 DIAGNOSIS — Z8673 Personal history of transient ischemic attack (TIA), and cerebral infarction without residual deficits: Secondary | ICD-10-CM | POA: Diagnosis not present

## 2019-05-06 DIAGNOSIS — I10 Essential (primary) hypertension: Secondary | ICD-10-CM | POA: Diagnosis not present

## 2019-05-06 DIAGNOSIS — Z90722 Acquired absence of ovaries, bilateral: Secondary | ICD-10-CM | POA: Diagnosis not present

## 2019-05-06 DIAGNOSIS — C569 Malignant neoplasm of unspecified ovary: Secondary | ICD-10-CM | POA: Diagnosis not present

## 2019-05-06 DIAGNOSIS — C787 Secondary malignant neoplasm of liver and intrahepatic bile duct: Secondary | ICD-10-CM | POA: Diagnosis not present

## 2019-05-06 DIAGNOSIS — C562 Malignant neoplasm of left ovary: Secondary | ICD-10-CM | POA: Diagnosis not present

## 2019-05-06 DIAGNOSIS — N898 Other specified noninflammatory disorders of vagina: Secondary | ICD-10-CM | POA: Diagnosis not present

## 2019-05-20 DIAGNOSIS — Z5111 Encounter for antineoplastic chemotherapy: Secondary | ICD-10-CM | POA: Diagnosis not present

## 2019-05-20 DIAGNOSIS — C569 Malignant neoplasm of unspecified ovary: Secondary | ICD-10-CM | POA: Diagnosis not present

## 2019-06-02 DIAGNOSIS — Z5112 Encounter for antineoplastic immunotherapy: Secondary | ICD-10-CM | POA: Diagnosis not present

## 2019-06-02 DIAGNOSIS — Z5111 Encounter for antineoplastic chemotherapy: Secondary | ICD-10-CM | POA: Diagnosis not present

## 2019-06-02 DIAGNOSIS — C569 Malignant neoplasm of unspecified ovary: Secondary | ICD-10-CM | POA: Diagnosis not present

## 2019-06-03 DIAGNOSIS — C569 Malignant neoplasm of unspecified ovary: Secondary | ICD-10-CM | POA: Diagnosis not present

## 2019-06-03 DIAGNOSIS — Z5111 Encounter for antineoplastic chemotherapy: Secondary | ICD-10-CM | POA: Diagnosis not present

## 2019-06-17 DIAGNOSIS — C569 Malignant neoplasm of unspecified ovary: Secondary | ICD-10-CM | POA: Diagnosis not present

## 2019-06-17 DIAGNOSIS — Z5111 Encounter for antineoplastic chemotherapy: Secondary | ICD-10-CM | POA: Diagnosis not present

## 2019-07-01 DIAGNOSIS — Z5111 Encounter for antineoplastic chemotherapy: Secondary | ICD-10-CM | POA: Diagnosis not present

## 2019-07-01 DIAGNOSIS — Z90722 Acquired absence of ovaries, bilateral: Secondary | ICD-10-CM | POA: Diagnosis not present

## 2019-07-01 DIAGNOSIS — Z90710 Acquired absence of both cervix and uterus: Secondary | ICD-10-CM | POA: Diagnosis not present

## 2019-07-01 DIAGNOSIS — G620 Drug-induced polyneuropathy: Secondary | ICD-10-CM | POA: Diagnosis not present

## 2019-07-01 DIAGNOSIS — C569 Malignant neoplasm of unspecified ovary: Secondary | ICD-10-CM | POA: Diagnosis not present

## 2019-07-01 DIAGNOSIS — C787 Secondary malignant neoplasm of liver and intrahepatic bile duct: Secondary | ICD-10-CM | POA: Diagnosis not present

## 2019-07-01 DIAGNOSIS — J45909 Unspecified asthma, uncomplicated: Secondary | ICD-10-CM | POA: Diagnosis not present

## 2019-07-01 DIAGNOSIS — I10 Essential (primary) hypertension: Secondary | ICD-10-CM | POA: Diagnosis not present

## 2019-07-01 DIAGNOSIS — Z9889 Other specified postprocedural states: Secondary | ICD-10-CM | POA: Diagnosis not present

## 2019-07-01 DIAGNOSIS — T451X5A Adverse effect of antineoplastic and immunosuppressive drugs, initial encounter: Secondary | ICD-10-CM | POA: Diagnosis not present

## 2019-07-01 DIAGNOSIS — C786 Secondary malignant neoplasm of retroperitoneum and peritoneum: Secondary | ICD-10-CM | POA: Diagnosis not present

## 2019-07-15 DIAGNOSIS — C569 Malignant neoplasm of unspecified ovary: Secondary | ICD-10-CM | POA: Diagnosis not present

## 2019-07-15 DIAGNOSIS — Z5111 Encounter for antineoplastic chemotherapy: Secondary | ICD-10-CM | POA: Diagnosis not present

## 2019-07-21 ENCOUNTER — Ambulatory Visit: Payer: PPO | Attending: Internal Medicine

## 2019-07-21 DIAGNOSIS — Z20822 Contact with and (suspected) exposure to covid-19: Secondary | ICD-10-CM

## 2019-07-22 LAB — NOVEL CORONAVIRUS, NAA: SARS-CoV-2, NAA: NOT DETECTED

## 2019-07-25 DIAGNOSIS — Z5111 Encounter for antineoplastic chemotherapy: Secondary | ICD-10-CM | POA: Diagnosis not present

## 2019-07-25 DIAGNOSIS — Z9689 Presence of other specified functional implants: Secondary | ICD-10-CM | POA: Diagnosis not present

## 2019-07-25 DIAGNOSIS — C569 Malignant neoplasm of unspecified ovary: Secondary | ICD-10-CM | POA: Diagnosis not present

## 2019-07-25 DIAGNOSIS — R911 Solitary pulmonary nodule: Secondary | ICD-10-CM | POA: Diagnosis not present

## 2019-07-25 DIAGNOSIS — R918 Other nonspecific abnormal finding of lung field: Secondary | ICD-10-CM | POA: Diagnosis not present

## 2019-07-25 DIAGNOSIS — C775 Secondary and unspecified malignant neoplasm of intrapelvic lymph nodes: Secondary | ICD-10-CM | POA: Diagnosis not present

## 2019-07-25 DIAGNOSIS — C772 Secondary and unspecified malignant neoplasm of intra-abdominal lymph nodes: Secondary | ICD-10-CM | POA: Diagnosis not present

## 2019-07-29 DIAGNOSIS — C569 Malignant neoplasm of unspecified ovary: Secondary | ICD-10-CM | POA: Diagnosis not present

## 2019-07-29 DIAGNOSIS — I10 Essential (primary) hypertension: Secondary | ICD-10-CM | POA: Diagnosis not present

## 2019-07-29 DIAGNOSIS — Z5111 Encounter for antineoplastic chemotherapy: Secondary | ICD-10-CM | POA: Diagnosis not present

## 2019-08-05 DIAGNOSIS — C569 Malignant neoplasm of unspecified ovary: Secondary | ICD-10-CM | POA: Diagnosis not present

## 2019-08-11 DIAGNOSIS — Z5111 Encounter for antineoplastic chemotherapy: Secondary | ICD-10-CM | POA: Diagnosis not present

## 2019-08-11 DIAGNOSIS — I10 Essential (primary) hypertension: Secondary | ICD-10-CM | POA: Diagnosis not present

## 2019-08-11 DIAGNOSIS — C569 Malignant neoplasm of unspecified ovary: Secondary | ICD-10-CM | POA: Diagnosis not present

## 2019-08-11 DIAGNOSIS — G620 Drug-induced polyneuropathy: Secondary | ICD-10-CM | POA: Diagnosis not present

## 2019-08-13 DIAGNOSIS — C569 Malignant neoplasm of unspecified ovary: Secondary | ICD-10-CM | POA: Diagnosis not present

## 2019-08-13 DIAGNOSIS — M810 Age-related osteoporosis without current pathological fracture: Secondary | ICD-10-CM | POA: Diagnosis not present

## 2019-08-13 DIAGNOSIS — I10 Essential (primary) hypertension: Secondary | ICD-10-CM | POA: Diagnosis not present

## 2019-08-13 DIAGNOSIS — E034 Atrophy of thyroid (acquired): Secondary | ICD-10-CM | POA: Diagnosis not present

## 2019-08-13 DIAGNOSIS — G620 Drug-induced polyneuropathy: Secondary | ICD-10-CM | POA: Diagnosis not present

## 2019-08-20 DIAGNOSIS — I10 Essential (primary) hypertension: Secondary | ICD-10-CM | POA: Diagnosis not present

## 2019-08-26 DIAGNOSIS — Z452 Encounter for adjustment and management of vascular access device: Secondary | ICD-10-CM | POA: Diagnosis not present

## 2019-08-26 DIAGNOSIS — J45909 Unspecified asthma, uncomplicated: Secondary | ICD-10-CM | POA: Diagnosis not present

## 2019-08-26 DIAGNOSIS — G629 Polyneuropathy, unspecified: Secondary | ICD-10-CM | POA: Diagnosis not present

## 2019-08-26 DIAGNOSIS — Z5111 Encounter for antineoplastic chemotherapy: Secondary | ICD-10-CM | POA: Diagnosis not present

## 2019-08-26 DIAGNOSIS — I10 Essential (primary) hypertension: Secondary | ICD-10-CM | POA: Diagnosis not present

## 2019-08-26 DIAGNOSIS — Z5112 Encounter for antineoplastic immunotherapy: Secondary | ICD-10-CM | POA: Diagnosis not present

## 2019-08-26 DIAGNOSIS — C569 Malignant neoplasm of unspecified ovary: Secondary | ICD-10-CM | POA: Diagnosis not present

## 2019-08-27 DIAGNOSIS — M8949 Other hypertrophic osteoarthropathy, multiple sites: Secondary | ICD-10-CM | POA: Diagnosis not present

## 2019-08-27 DIAGNOSIS — M810 Age-related osteoporosis without current pathological fracture: Secondary | ICD-10-CM | POA: Diagnosis not present

## 2019-08-27 DIAGNOSIS — J4520 Mild intermittent asthma, uncomplicated: Secondary | ICD-10-CM | POA: Diagnosis not present

## 2019-08-27 DIAGNOSIS — C569 Malignant neoplasm of unspecified ovary: Secondary | ICD-10-CM | POA: Diagnosis not present

## 2019-08-27 DIAGNOSIS — E034 Atrophy of thyroid (acquired): Secondary | ICD-10-CM | POA: Diagnosis not present

## 2019-08-27 DIAGNOSIS — I10 Essential (primary) hypertension: Secondary | ICD-10-CM | POA: Diagnosis not present

## 2019-08-27 DIAGNOSIS — G620 Drug-induced polyneuropathy: Secondary | ICD-10-CM | POA: Diagnosis not present

## 2019-09-09 DIAGNOSIS — I10 Essential (primary) hypertension: Secondary | ICD-10-CM | POA: Diagnosis not present

## 2019-09-09 DIAGNOSIS — G629 Polyneuropathy, unspecified: Secondary | ICD-10-CM | POA: Diagnosis not present

## 2019-09-09 DIAGNOSIS — Z79899 Other long term (current) drug therapy: Secondary | ICD-10-CM | POA: Diagnosis not present

## 2019-09-09 DIAGNOSIS — J45909 Unspecified asthma, uncomplicated: Secondary | ICD-10-CM | POA: Diagnosis not present

## 2019-09-09 DIAGNOSIS — Z5111 Encounter for antineoplastic chemotherapy: Secondary | ICD-10-CM | POA: Diagnosis not present

## 2019-09-09 DIAGNOSIS — C569 Malignant neoplasm of unspecified ovary: Secondary | ICD-10-CM | POA: Diagnosis not present

## 2019-09-09 DIAGNOSIS — Z5112 Encounter for antineoplastic immunotherapy: Secondary | ICD-10-CM | POA: Diagnosis not present

## 2019-09-09 DIAGNOSIS — G620 Drug-induced polyneuropathy: Secondary | ICD-10-CM | POA: Diagnosis not present

## 2019-09-23 DIAGNOSIS — G44229 Chronic tension-type headache, not intractable: Secondary | ICD-10-CM | POA: Diagnosis not present

## 2019-09-23 DIAGNOSIS — Z5112 Encounter for antineoplastic immunotherapy: Secondary | ICD-10-CM | POA: Diagnosis not present

## 2019-09-23 DIAGNOSIS — J45909 Unspecified asthma, uncomplicated: Secondary | ICD-10-CM | POA: Diagnosis not present

## 2019-09-23 DIAGNOSIS — G620 Drug-induced polyneuropathy: Secondary | ICD-10-CM | POA: Diagnosis not present

## 2019-09-23 DIAGNOSIS — R519 Headache, unspecified: Secondary | ICD-10-CM | POA: Diagnosis not present

## 2019-09-23 DIAGNOSIS — G629 Polyneuropathy, unspecified: Secondary | ICD-10-CM | POA: Diagnosis not present

## 2019-09-23 DIAGNOSIS — I10 Essential (primary) hypertension: Secondary | ICD-10-CM | POA: Diagnosis not present

## 2019-09-23 DIAGNOSIS — Z5111 Encounter for antineoplastic chemotherapy: Secondary | ICD-10-CM | POA: Diagnosis not present

## 2019-09-23 DIAGNOSIS — C569 Malignant neoplasm of unspecified ovary: Secondary | ICD-10-CM | POA: Diagnosis not present

## 2019-09-23 DIAGNOSIS — C800 Disseminated malignant neoplasm, unspecified: Secondary | ICD-10-CM | POA: Diagnosis not present

## 2019-09-23 DIAGNOSIS — C561 Malignant neoplasm of right ovary: Secondary | ICD-10-CM | POA: Diagnosis not present

## 2019-09-23 DIAGNOSIS — Z79899 Other long term (current) drug therapy: Secondary | ICD-10-CM | POA: Diagnosis not present

## 2019-09-23 DIAGNOSIS — C562 Malignant neoplasm of left ovary: Secondary | ICD-10-CM | POA: Diagnosis not present

## 2019-10-03 DIAGNOSIS — C569 Malignant neoplasm of unspecified ovary: Secondary | ICD-10-CM | POA: Diagnosis not present

## 2019-10-07 DIAGNOSIS — C569 Malignant neoplasm of unspecified ovary: Secondary | ICD-10-CM | POA: Diagnosis not present

## 2019-10-07 DIAGNOSIS — Z5112 Encounter for antineoplastic immunotherapy: Secondary | ICD-10-CM | POA: Diagnosis not present

## 2019-10-20 DIAGNOSIS — G620 Drug-induced polyneuropathy: Secondary | ICD-10-CM | POA: Diagnosis not present

## 2019-10-20 DIAGNOSIS — R197 Diarrhea, unspecified: Secondary | ICD-10-CM | POA: Diagnosis not present

## 2019-10-20 DIAGNOSIS — C569 Malignant neoplasm of unspecified ovary: Secondary | ICD-10-CM | POA: Diagnosis not present

## 2019-10-20 DIAGNOSIS — I10 Essential (primary) hypertension: Secondary | ICD-10-CM | POA: Diagnosis not present

## 2019-10-20 DIAGNOSIS — Z5111 Encounter for antineoplastic chemotherapy: Secondary | ICD-10-CM | POA: Diagnosis not present

## 2019-10-20 DIAGNOSIS — Z5112 Encounter for antineoplastic immunotherapy: Secondary | ICD-10-CM | POA: Diagnosis not present

## 2019-10-21 DIAGNOSIS — C569 Malignant neoplasm of unspecified ovary: Secondary | ICD-10-CM | POA: Diagnosis not present

## 2019-10-23 DIAGNOSIS — I10 Essential (primary) hypertension: Secondary | ICD-10-CM | POA: Diagnosis not present

## 2019-10-23 DIAGNOSIS — E034 Atrophy of thyroid (acquired): Secondary | ICD-10-CM | POA: Diagnosis not present

## 2019-10-23 DIAGNOSIS — G620 Drug-induced polyneuropathy: Secondary | ICD-10-CM | POA: Diagnosis not present

## 2019-10-23 DIAGNOSIS — C569 Malignant neoplasm of unspecified ovary: Secondary | ICD-10-CM | POA: Diagnosis not present

## 2019-11-18 DIAGNOSIS — R197 Diarrhea, unspecified: Secondary | ICD-10-CM | POA: Diagnosis not present

## 2019-11-18 DIAGNOSIS — Z5112 Encounter for antineoplastic immunotherapy: Secondary | ICD-10-CM | POA: Diagnosis not present

## 2019-11-18 DIAGNOSIS — R519 Headache, unspecified: Secondary | ICD-10-CM | POA: Diagnosis not present

## 2019-11-18 DIAGNOSIS — Z5111 Encounter for antineoplastic chemotherapy: Secondary | ICD-10-CM | POA: Diagnosis not present

## 2019-11-18 DIAGNOSIS — C481 Malignant neoplasm of specified parts of peritoneum: Secondary | ICD-10-CM | POA: Diagnosis not present

## 2019-11-18 DIAGNOSIS — I10 Essential (primary) hypertension: Secondary | ICD-10-CM | POA: Diagnosis not present

## 2019-11-18 DIAGNOSIS — C569 Malignant neoplasm of unspecified ovary: Secondary | ICD-10-CM | POA: Diagnosis not present

## 2019-11-18 DIAGNOSIS — G629 Polyneuropathy, unspecified: Secondary | ICD-10-CM | POA: Diagnosis not present

## 2019-11-21 DIAGNOSIS — E034 Atrophy of thyroid (acquired): Secondary | ICD-10-CM | POA: Diagnosis not present

## 2019-11-21 DIAGNOSIS — M810 Age-related osteoporosis without current pathological fracture: Secondary | ICD-10-CM | POA: Diagnosis not present

## 2019-11-21 DIAGNOSIS — I10 Essential (primary) hypertension: Secondary | ICD-10-CM | POA: Diagnosis not present

## 2019-11-21 DIAGNOSIS — C569 Malignant neoplasm of unspecified ovary: Secondary | ICD-10-CM | POA: Diagnosis not present

## 2019-11-26 DIAGNOSIS — M810 Age-related osteoporosis without current pathological fracture: Secondary | ICD-10-CM | POA: Diagnosis not present

## 2019-11-26 DIAGNOSIS — C569 Malignant neoplasm of unspecified ovary: Secondary | ICD-10-CM | POA: Diagnosis not present

## 2019-11-26 DIAGNOSIS — M8949 Other hypertrophic osteoarthropathy, multiple sites: Secondary | ICD-10-CM | POA: Diagnosis not present

## 2019-11-26 DIAGNOSIS — G620 Drug-induced polyneuropathy: Secondary | ICD-10-CM | POA: Diagnosis not present

## 2019-11-26 DIAGNOSIS — E034 Atrophy of thyroid (acquired): Secondary | ICD-10-CM | POA: Diagnosis not present

## 2019-11-26 DIAGNOSIS — Z0000 Encounter for general adult medical examination without abnormal findings: Secondary | ICD-10-CM | POA: Diagnosis not present

## 2019-11-26 DIAGNOSIS — I10 Essential (primary) hypertension: Secondary | ICD-10-CM | POA: Diagnosis not present

## 2019-12-16 DIAGNOSIS — C569 Malignant neoplasm of unspecified ovary: Secondary | ICD-10-CM | POA: Diagnosis not present

## 2019-12-16 DIAGNOSIS — Z79899 Other long term (current) drug therapy: Secondary | ICD-10-CM | POA: Diagnosis not present

## 2019-12-16 DIAGNOSIS — Z9221 Personal history of antineoplastic chemotherapy: Secondary | ICD-10-CM | POA: Diagnosis not present

## 2019-12-16 DIAGNOSIS — R519 Headache, unspecified: Secondary | ICD-10-CM | POA: Diagnosis not present

## 2019-12-16 DIAGNOSIS — C786 Secondary malignant neoplasm of retroperitoneum and peritoneum: Secondary | ICD-10-CM | POA: Diagnosis not present

## 2019-12-16 DIAGNOSIS — Z5111 Encounter for antineoplastic chemotherapy: Secondary | ICD-10-CM | POA: Diagnosis not present

## 2019-12-16 DIAGNOSIS — C787 Secondary malignant neoplasm of liver and intrahepatic bile duct: Secondary | ICD-10-CM | POA: Diagnosis not present

## 2019-12-16 DIAGNOSIS — Z5112 Encounter for antineoplastic immunotherapy: Secondary | ICD-10-CM | POA: Diagnosis not present

## 2019-12-16 DIAGNOSIS — I10 Essential (primary) hypertension: Secondary | ICD-10-CM | POA: Diagnosis not present

## 2019-12-16 DIAGNOSIS — G629 Polyneuropathy, unspecified: Secondary | ICD-10-CM | POA: Diagnosis not present

## 2019-12-16 DIAGNOSIS — J45909 Unspecified asthma, uncomplicated: Secondary | ICD-10-CM | POA: Diagnosis not present

## 2019-12-16 DIAGNOSIS — C561 Malignant neoplasm of right ovary: Secondary | ICD-10-CM | POA: Diagnosis not present

## 2019-12-16 DIAGNOSIS — C562 Malignant neoplasm of left ovary: Secondary | ICD-10-CM | POA: Diagnosis not present

## 2019-12-16 DIAGNOSIS — R197 Diarrhea, unspecified: Secondary | ICD-10-CM | POA: Diagnosis not present

## 2019-12-30 DIAGNOSIS — R918 Other nonspecific abnormal finding of lung field: Secondary | ICD-10-CM | POA: Diagnosis not present

## 2019-12-30 DIAGNOSIS — C569 Malignant neoplasm of unspecified ovary: Secondary | ICD-10-CM | POA: Diagnosis not present

## 2019-12-30 DIAGNOSIS — C775 Secondary and unspecified malignant neoplasm of intrapelvic lymph nodes: Secondary | ICD-10-CM | POA: Diagnosis not present

## 2019-12-30 DIAGNOSIS — R911 Solitary pulmonary nodule: Secondary | ICD-10-CM | POA: Diagnosis not present

## 2019-12-30 DIAGNOSIS — R590 Localized enlarged lymph nodes: Secondary | ICD-10-CM | POA: Diagnosis not present

## 2019-12-30 DIAGNOSIS — K869 Disease of pancreas, unspecified: Secondary | ICD-10-CM | POA: Diagnosis not present

## 2019-12-30 DIAGNOSIS — Z5112 Encounter for antineoplastic immunotherapy: Secondary | ICD-10-CM | POA: Diagnosis not present

## 2020-01-13 DIAGNOSIS — I10 Essential (primary) hypertension: Secondary | ICD-10-CM | POA: Diagnosis not present

## 2020-01-13 DIAGNOSIS — G629 Polyneuropathy, unspecified: Secondary | ICD-10-CM | POA: Diagnosis not present

## 2020-01-13 DIAGNOSIS — Z5112 Encounter for antineoplastic immunotherapy: Secondary | ICD-10-CM | POA: Diagnosis not present

## 2020-01-13 DIAGNOSIS — C787 Secondary malignant neoplasm of liver and intrahepatic bile duct: Secondary | ICD-10-CM | POA: Diagnosis not present

## 2020-01-13 DIAGNOSIS — C569 Malignant neoplasm of unspecified ovary: Secondary | ICD-10-CM | POA: Diagnosis not present

## 2020-01-13 DIAGNOSIS — R519 Headache, unspecified: Secondary | ICD-10-CM | POA: Diagnosis not present

## 2020-01-27 DIAGNOSIS — C569 Malignant neoplasm of unspecified ovary: Secondary | ICD-10-CM | POA: Diagnosis not present

## 2020-01-27 DIAGNOSIS — Z5112 Encounter for antineoplastic immunotherapy: Secondary | ICD-10-CM | POA: Diagnosis not present

## 2020-02-10 DIAGNOSIS — Z5111 Encounter for antineoplastic chemotherapy: Secondary | ICD-10-CM | POA: Diagnosis not present

## 2020-02-10 DIAGNOSIS — J45909 Unspecified asthma, uncomplicated: Secondary | ICD-10-CM | POA: Diagnosis not present

## 2020-02-10 DIAGNOSIS — C787 Secondary malignant neoplasm of liver and intrahepatic bile duct: Secondary | ICD-10-CM | POA: Diagnosis not present

## 2020-02-10 DIAGNOSIS — Z79899 Other long term (current) drug therapy: Secondary | ICD-10-CM | POA: Diagnosis not present

## 2020-02-10 DIAGNOSIS — R197 Diarrhea, unspecified: Secondary | ICD-10-CM | POA: Diagnosis not present

## 2020-02-10 DIAGNOSIS — R519 Headache, unspecified: Secondary | ICD-10-CM | POA: Diagnosis not present

## 2020-02-10 DIAGNOSIS — C786 Secondary malignant neoplasm of retroperitoneum and peritoneum: Secondary | ICD-10-CM | POA: Diagnosis not present

## 2020-02-10 DIAGNOSIS — C561 Malignant neoplasm of right ovary: Secondary | ICD-10-CM | POA: Diagnosis not present

## 2020-02-10 DIAGNOSIS — C562 Malignant neoplasm of left ovary: Secondary | ICD-10-CM | POA: Diagnosis not present

## 2020-02-10 DIAGNOSIS — G629 Polyneuropathy, unspecified: Secondary | ICD-10-CM | POA: Diagnosis not present

## 2020-02-10 DIAGNOSIS — C569 Malignant neoplasm of unspecified ovary: Secondary | ICD-10-CM | POA: Diagnosis not present

## 2020-02-10 DIAGNOSIS — I10 Essential (primary) hypertension: Secondary | ICD-10-CM | POA: Diagnosis not present

## 2020-02-10 DIAGNOSIS — C7951 Secondary malignant neoplasm of bone: Secondary | ICD-10-CM | POA: Diagnosis not present

## 2020-02-10 DIAGNOSIS — Z5112 Encounter for antineoplastic immunotherapy: Secondary | ICD-10-CM | POA: Diagnosis not present

## 2020-02-24 DIAGNOSIS — Z5112 Encounter for antineoplastic immunotherapy: Secondary | ICD-10-CM | POA: Diagnosis not present

## 2020-02-24 DIAGNOSIS — C569 Malignant neoplasm of unspecified ovary: Secondary | ICD-10-CM | POA: Diagnosis not present

## 2020-03-09 DIAGNOSIS — Z5111 Encounter for antineoplastic chemotherapy: Secondary | ICD-10-CM | POA: Diagnosis not present

## 2020-03-09 DIAGNOSIS — C775 Secondary and unspecified malignant neoplasm of intrapelvic lymph nodes: Secondary | ICD-10-CM | POA: Diagnosis not present

## 2020-03-09 DIAGNOSIS — C569 Malignant neoplasm of unspecified ovary: Secondary | ICD-10-CM | POA: Diagnosis not present

## 2020-03-09 DIAGNOSIS — Z5112 Encounter for antineoplastic immunotherapy: Secondary | ICD-10-CM | POA: Diagnosis not present

## 2020-03-23 DIAGNOSIS — Z5112 Encounter for antineoplastic immunotherapy: Secondary | ICD-10-CM | POA: Diagnosis not present

## 2020-03-23 DIAGNOSIS — C569 Malignant neoplasm of unspecified ovary: Secondary | ICD-10-CM | POA: Diagnosis not present

## 2020-04-06 DIAGNOSIS — R590 Localized enlarged lymph nodes: Secondary | ICD-10-CM | POA: Diagnosis not present

## 2020-04-06 DIAGNOSIS — R197 Diarrhea, unspecified: Secondary | ICD-10-CM | POA: Diagnosis not present

## 2020-04-06 DIAGNOSIS — G629 Polyneuropathy, unspecified: Secondary | ICD-10-CM | POA: Diagnosis not present

## 2020-04-06 DIAGNOSIS — Z5111 Encounter for antineoplastic chemotherapy: Secondary | ICD-10-CM | POA: Diagnosis not present

## 2020-04-06 DIAGNOSIS — J45909 Unspecified asthma, uncomplicated: Secondary | ICD-10-CM | POA: Diagnosis not present

## 2020-04-06 DIAGNOSIS — Z903 Acquired absence of stomach [part of]: Secondary | ICD-10-CM | POA: Diagnosis not present

## 2020-04-06 DIAGNOSIS — K8689 Other specified diseases of pancreas: Secondary | ICD-10-CM | POA: Diagnosis not present

## 2020-04-06 DIAGNOSIS — Z90722 Acquired absence of ovaries, bilateral: Secondary | ICD-10-CM | POA: Diagnosis not present

## 2020-04-06 DIAGNOSIS — C569 Malignant neoplasm of unspecified ovary: Secondary | ICD-10-CM | POA: Diagnosis not present

## 2020-04-06 DIAGNOSIS — Z8719 Personal history of other diseases of the digestive system: Secondary | ICD-10-CM | POA: Diagnosis not present

## 2020-04-06 DIAGNOSIS — R918 Other nonspecific abnormal finding of lung field: Secondary | ICD-10-CM | POA: Diagnosis not present

## 2020-04-06 DIAGNOSIS — Z90710 Acquired absence of both cervix and uterus: Secondary | ICD-10-CM | POA: Diagnosis not present

## 2020-04-06 DIAGNOSIS — Z9049 Acquired absence of other specified parts of digestive tract: Secondary | ICD-10-CM | POA: Diagnosis not present

## 2020-04-06 DIAGNOSIS — Z9081 Acquired absence of spleen: Secondary | ICD-10-CM | POA: Diagnosis not present

## 2020-04-06 DIAGNOSIS — C786 Secondary malignant neoplasm of retroperitoneum and peritoneum: Secondary | ICD-10-CM | POA: Diagnosis not present

## 2020-04-06 DIAGNOSIS — C775 Secondary and unspecified malignant neoplasm of intrapelvic lymph nodes: Secondary | ICD-10-CM | POA: Diagnosis not present

## 2020-04-06 DIAGNOSIS — C787 Secondary malignant neoplasm of liver and intrahepatic bile duct: Secondary | ICD-10-CM | POA: Diagnosis not present

## 2020-04-06 DIAGNOSIS — Z5112 Encounter for antineoplastic immunotherapy: Secondary | ICD-10-CM | POA: Diagnosis not present

## 2020-04-06 DIAGNOSIS — I10 Essential (primary) hypertension: Secondary | ICD-10-CM | POA: Diagnosis not present

## 2020-04-06 DIAGNOSIS — C7889 Secondary malignant neoplasm of other digestive organs: Secondary | ICD-10-CM | POA: Diagnosis not present

## 2020-04-20 DIAGNOSIS — C569 Malignant neoplasm of unspecified ovary: Secondary | ICD-10-CM | POA: Diagnosis not present

## 2020-04-20 DIAGNOSIS — Z5111 Encounter for antineoplastic chemotherapy: Secondary | ICD-10-CM | POA: Diagnosis not present

## 2020-05-11 DIAGNOSIS — Z79899 Other long term (current) drug therapy: Secondary | ICD-10-CM | POA: Diagnosis not present

## 2020-05-11 DIAGNOSIS — D630 Anemia in neoplastic disease: Secondary | ICD-10-CM | POA: Diagnosis not present

## 2020-05-11 DIAGNOSIS — C786 Secondary malignant neoplasm of retroperitoneum and peritoneum: Secondary | ICD-10-CM | POA: Diagnosis not present

## 2020-05-11 DIAGNOSIS — C569 Malignant neoplasm of unspecified ovary: Secondary | ICD-10-CM | POA: Diagnosis not present

## 2020-05-11 DIAGNOSIS — Z5111 Encounter for antineoplastic chemotherapy: Secondary | ICD-10-CM | POA: Diagnosis not present

## 2020-05-11 DIAGNOSIS — I10 Essential (primary) hypertension: Secondary | ICD-10-CM | POA: Diagnosis not present

## 2020-05-11 DIAGNOSIS — R197 Diarrhea, unspecified: Secondary | ICD-10-CM | POA: Diagnosis not present

## 2020-05-11 DIAGNOSIS — R5383 Other fatigue: Secondary | ICD-10-CM | POA: Diagnosis not present

## 2020-05-11 DIAGNOSIS — C778 Secondary and unspecified malignant neoplasm of lymph nodes of multiple regions: Secondary | ICD-10-CM | POA: Diagnosis not present

## 2020-05-11 DIAGNOSIS — C563 Malignant neoplasm of bilateral ovaries: Secondary | ICD-10-CM | POA: Diagnosis not present

## 2020-05-11 DIAGNOSIS — Z23 Encounter for immunization: Secondary | ICD-10-CM | POA: Diagnosis not present

## 2020-05-11 DIAGNOSIS — G629 Polyneuropathy, unspecified: Secondary | ICD-10-CM | POA: Diagnosis not present

## 2020-05-24 DIAGNOSIS — G620 Drug-induced polyneuropathy: Secondary | ICD-10-CM | POA: Diagnosis not present

## 2020-05-24 DIAGNOSIS — I10 Essential (primary) hypertension: Secondary | ICD-10-CM | POA: Diagnosis not present

## 2020-05-24 DIAGNOSIS — E034 Atrophy of thyroid (acquired): Secondary | ICD-10-CM | POA: Diagnosis not present

## 2020-05-24 DIAGNOSIS — C569 Malignant neoplasm of unspecified ovary: Secondary | ICD-10-CM | POA: Diagnosis not present

## 2020-05-27 DIAGNOSIS — J4520 Mild intermittent asthma, uncomplicated: Secondary | ICD-10-CM | POA: Diagnosis not present

## 2020-05-27 DIAGNOSIS — E034 Atrophy of thyroid (acquired): Secondary | ICD-10-CM | POA: Diagnosis not present

## 2020-05-27 DIAGNOSIS — C569 Malignant neoplasm of unspecified ovary: Secondary | ICD-10-CM | POA: Diagnosis not present

## 2020-05-27 DIAGNOSIS — M810 Age-related osteoporosis without current pathological fracture: Secondary | ICD-10-CM | POA: Diagnosis not present

## 2020-05-27 DIAGNOSIS — I10 Essential (primary) hypertension: Secondary | ICD-10-CM | POA: Diagnosis not present

## 2020-05-27 DIAGNOSIS — G620 Drug-induced polyneuropathy: Secondary | ICD-10-CM | POA: Diagnosis not present

## 2020-06-01 DIAGNOSIS — G620 Drug-induced polyneuropathy: Secondary | ICD-10-CM | POA: Diagnosis not present

## 2020-06-01 DIAGNOSIS — C787 Secondary malignant neoplasm of liver and intrahepatic bile duct: Secondary | ICD-10-CM | POA: Diagnosis not present

## 2020-06-01 DIAGNOSIS — T451X5A Adverse effect of antineoplastic and immunosuppressive drugs, initial encounter: Secondary | ICD-10-CM | POA: Diagnosis not present

## 2020-06-01 DIAGNOSIS — J45909 Unspecified asthma, uncomplicated: Secondary | ICD-10-CM | POA: Diagnosis not present

## 2020-06-01 DIAGNOSIS — C563 Malignant neoplasm of bilateral ovaries: Secondary | ICD-10-CM | POA: Diagnosis not present

## 2020-06-01 DIAGNOSIS — R911 Solitary pulmonary nodule: Secondary | ICD-10-CM | POA: Diagnosis not present

## 2020-06-01 DIAGNOSIS — I10 Essential (primary) hypertension: Secondary | ICD-10-CM | POA: Diagnosis not present

## 2020-06-01 DIAGNOSIS — C778 Secondary and unspecified malignant neoplasm of lymph nodes of multiple regions: Secondary | ICD-10-CM | POA: Diagnosis not present

## 2020-06-01 DIAGNOSIS — D6481 Anemia due to antineoplastic chemotherapy: Secondary | ICD-10-CM | POA: Diagnosis not present

## 2020-06-01 DIAGNOSIS — C786 Secondary malignant neoplasm of retroperitoneum and peritoneum: Secondary | ICD-10-CM | POA: Diagnosis not present

## 2020-06-01 DIAGNOSIS — C569 Malignant neoplasm of unspecified ovary: Secondary | ICD-10-CM | POA: Diagnosis not present

## 2020-06-01 DIAGNOSIS — R5383 Other fatigue: Secondary | ICD-10-CM | POA: Diagnosis not present

## 2020-06-01 DIAGNOSIS — Z5111 Encounter for antineoplastic chemotherapy: Secondary | ICD-10-CM | POA: Diagnosis not present

## 2020-06-01 DIAGNOSIS — Z5112 Encounter for antineoplastic immunotherapy: Secondary | ICD-10-CM | POA: Diagnosis not present

## 2020-06-22 DIAGNOSIS — Z5112 Encounter for antineoplastic immunotherapy: Secondary | ICD-10-CM | POA: Diagnosis not present

## 2020-06-22 DIAGNOSIS — M7989 Other specified soft tissue disorders: Secondary | ICD-10-CM | POA: Diagnosis not present

## 2020-06-22 DIAGNOSIS — C569 Malignant neoplasm of unspecified ovary: Secondary | ICD-10-CM | POA: Diagnosis not present

## 2020-06-22 DIAGNOSIS — C563 Malignant neoplasm of bilateral ovaries: Secondary | ICD-10-CM | POA: Diagnosis not present

## 2020-06-22 DIAGNOSIS — Z5111 Encounter for antineoplastic chemotherapy: Secondary | ICD-10-CM | POA: Diagnosis not present

## 2020-06-22 DIAGNOSIS — C778 Secondary and unspecified malignant neoplasm of lymph nodes of multiple regions: Secondary | ICD-10-CM | POA: Diagnosis not present

## 2020-06-24 DIAGNOSIS — E034 Atrophy of thyroid (acquired): Secondary | ICD-10-CM | POA: Diagnosis not present

## 2020-07-12 DIAGNOSIS — Z5112 Encounter for antineoplastic immunotherapy: Secondary | ICD-10-CM | POA: Diagnosis not present

## 2020-07-12 DIAGNOSIS — K869 Disease of pancreas, unspecified: Secondary | ICD-10-CM | POA: Diagnosis not present

## 2020-07-12 DIAGNOSIS — Z5111 Encounter for antineoplastic chemotherapy: Secondary | ICD-10-CM | POA: Diagnosis not present

## 2020-07-12 DIAGNOSIS — R911 Solitary pulmonary nodule: Secondary | ICD-10-CM | POA: Diagnosis not present

## 2020-07-12 DIAGNOSIS — C569 Malignant neoplasm of unspecified ovary: Secondary | ICD-10-CM | POA: Diagnosis not present

## 2020-07-13 DIAGNOSIS — M7989 Other specified soft tissue disorders: Secondary | ICD-10-CM | POA: Diagnosis not present

## 2020-07-13 DIAGNOSIS — D6481 Anemia due to antineoplastic chemotherapy: Secondary | ICD-10-CM | POA: Diagnosis not present

## 2020-07-13 DIAGNOSIS — R591 Generalized enlarged lymph nodes: Secondary | ICD-10-CM | POA: Diagnosis not present

## 2020-07-13 DIAGNOSIS — C561 Malignant neoplasm of right ovary: Secondary | ICD-10-CM | POA: Diagnosis not present

## 2020-07-13 DIAGNOSIS — R5383 Other fatigue: Secondary | ICD-10-CM | POA: Diagnosis not present

## 2020-07-13 DIAGNOSIS — R590 Localized enlarged lymph nodes: Secondary | ICD-10-CM | POA: Diagnosis not present

## 2020-07-13 DIAGNOSIS — G629 Polyneuropathy, unspecified: Secondary | ICD-10-CM | POA: Diagnosis not present

## 2020-07-13 DIAGNOSIS — Z7189 Other specified counseling: Secondary | ICD-10-CM | POA: Diagnosis not present

## 2020-07-13 DIAGNOSIS — Z5111 Encounter for antineoplastic chemotherapy: Secondary | ICD-10-CM | POA: Diagnosis not present

## 2020-07-13 DIAGNOSIS — C775 Secondary and unspecified malignant neoplasm of intrapelvic lymph nodes: Secondary | ICD-10-CM | POA: Diagnosis not present

## 2020-07-13 DIAGNOSIS — C786 Secondary malignant neoplasm of retroperitoneum and peritoneum: Secondary | ICD-10-CM | POA: Diagnosis not present

## 2020-07-13 DIAGNOSIS — Z79899 Other long term (current) drug therapy: Secondary | ICD-10-CM | POA: Diagnosis not present

## 2020-07-13 DIAGNOSIS — Z5112 Encounter for antineoplastic immunotherapy: Secondary | ICD-10-CM | POA: Diagnosis not present

## 2020-07-13 DIAGNOSIS — R911 Solitary pulmonary nodule: Secondary | ICD-10-CM | POA: Diagnosis not present

## 2020-07-13 DIAGNOSIS — Z903 Acquired absence of stomach [part of]: Secondary | ICD-10-CM | POA: Diagnosis not present

## 2020-07-13 DIAGNOSIS — I10 Essential (primary) hypertension: Secondary | ICD-10-CM | POA: Diagnosis not present

## 2020-07-13 DIAGNOSIS — C569 Malignant neoplasm of unspecified ovary: Secondary | ICD-10-CM | POA: Diagnosis not present

## 2020-09-08 ENCOUNTER — Other Ambulatory Visit: Payer: Self-pay | Admitting: Internal Medicine

## 2020-09-08 ENCOUNTER — Ambulatory Visit
Admission: RE | Admit: 2020-09-08 | Discharge: 2020-09-08 | Disposition: A | Discharge status: Home / Self Care | Payer: PPO | Source: Ambulatory Visit | Attending: Internal Medicine | Admitting: Internal Medicine

## 2020-09-08 ENCOUNTER — Other Ambulatory Visit: Payer: Self-pay

## 2020-09-08 DIAGNOSIS — C775 Secondary and unspecified malignant neoplasm of intrapelvic lymph nodes: Secondary | ICD-10-CM | POA: Diagnosis not present

## 2020-09-08 DIAGNOSIS — M7989 Other specified soft tissue disorders: Secondary | ICD-10-CM | POA: Insufficient documentation

## 2020-09-08 DIAGNOSIS — G620 Drug-induced polyneuropathy: Secondary | ICD-10-CM | POA: Diagnosis not present

## 2020-09-08 DIAGNOSIS — C569 Malignant neoplasm of unspecified ovary: Secondary | ICD-10-CM | POA: Diagnosis not present

## 2020-09-08 DIAGNOSIS — E034 Atrophy of thyroid (acquired): Secondary | ICD-10-CM | POA: Diagnosis not present

## 2020-09-08 DIAGNOSIS — I10 Essential (primary) hypertension: Secondary | ICD-10-CM | POA: Diagnosis not present

## 2020-09-08 DIAGNOSIS — R600 Localized edema: Secondary | ICD-10-CM | POA: Diagnosis not present

## 2020-09-15 DIAGNOSIS — C569 Malignant neoplasm of unspecified ovary: Secondary | ICD-10-CM | POA: Diagnosis not present

## 2020-09-17 DIAGNOSIS — C569 Malignant neoplasm of unspecified ovary: Secondary | ICD-10-CM | POA: Diagnosis not present

## 2020-09-21 DIAGNOSIS — M7989 Other specified soft tissue disorders: Secondary | ICD-10-CM | POA: Diagnosis not present

## 2020-09-21 DIAGNOSIS — D6481 Anemia due to antineoplastic chemotherapy: Secondary | ICD-10-CM | POA: Diagnosis not present

## 2020-09-21 DIAGNOSIS — G629 Polyneuropathy, unspecified: Secondary | ICD-10-CM | POA: Diagnosis not present

## 2020-09-21 DIAGNOSIS — K869 Disease of pancreas, unspecified: Secondary | ICD-10-CM | POA: Diagnosis not present

## 2020-09-21 DIAGNOSIS — R911 Solitary pulmonary nodule: Secondary | ICD-10-CM | POA: Diagnosis not present

## 2020-09-21 DIAGNOSIS — M810 Age-related osteoporosis without current pathological fracture: Secondary | ICD-10-CM | POA: Diagnosis not present

## 2020-09-21 DIAGNOSIS — C569 Malignant neoplasm of unspecified ovary: Secondary | ICD-10-CM | POA: Diagnosis not present

## 2020-09-21 DIAGNOSIS — T451X5D Adverse effect of antineoplastic and immunosuppressive drugs, subsequent encounter: Secondary | ICD-10-CM | POA: Diagnosis not present

## 2020-09-21 DIAGNOSIS — R590 Localized enlarged lymph nodes: Secondary | ICD-10-CM | POA: Diagnosis not present

## 2020-09-21 DIAGNOSIS — J45909 Unspecified asthma, uncomplicated: Secondary | ICD-10-CM | POA: Diagnosis not present

## 2020-09-21 DIAGNOSIS — C775 Secondary and unspecified malignant neoplasm of intrapelvic lymph nodes: Secondary | ICD-10-CM | POA: Diagnosis not present

## 2020-09-21 DIAGNOSIS — R5383 Other fatigue: Secondary | ICD-10-CM | POA: Diagnosis not present

## 2020-09-21 DIAGNOSIS — I10 Essential (primary) hypertension: Secondary | ICD-10-CM | POA: Diagnosis not present

## 2020-09-23 DIAGNOSIS — C569 Malignant neoplasm of unspecified ovary: Secondary | ICD-10-CM | POA: Diagnosis not present

## 2020-09-24 DIAGNOSIS — C569 Malignant neoplasm of unspecified ovary: Secondary | ICD-10-CM | POA: Diagnosis not present

## 2020-09-27 DIAGNOSIS — C569 Malignant neoplasm of unspecified ovary: Secondary | ICD-10-CM | POA: Diagnosis not present

## 2020-09-28 DIAGNOSIS — C569 Malignant neoplasm of unspecified ovary: Secondary | ICD-10-CM | POA: Diagnosis not present

## 2020-09-29 DIAGNOSIS — C569 Malignant neoplasm of unspecified ovary: Secondary | ICD-10-CM | POA: Diagnosis not present

## 2020-09-30 DIAGNOSIS — C569 Malignant neoplasm of unspecified ovary: Secondary | ICD-10-CM | POA: Diagnosis not present

## 2020-10-01 DIAGNOSIS — C569 Malignant neoplasm of unspecified ovary: Secondary | ICD-10-CM | POA: Diagnosis not present

## 2020-10-04 DIAGNOSIS — C569 Malignant neoplasm of unspecified ovary: Secondary | ICD-10-CM | POA: Diagnosis not present

## 2020-10-05 DIAGNOSIS — C569 Malignant neoplasm of unspecified ovary: Secondary | ICD-10-CM | POA: Diagnosis not present

## 2020-10-06 DIAGNOSIS — C569 Malignant neoplasm of unspecified ovary: Secondary | ICD-10-CM | POA: Diagnosis not present

## 2020-10-07 DIAGNOSIS — C569 Malignant neoplasm of unspecified ovary: Secondary | ICD-10-CM | POA: Diagnosis not present

## 2020-10-08 DIAGNOSIS — C569 Malignant neoplasm of unspecified ovary: Secondary | ICD-10-CM | POA: Diagnosis not present

## 2020-10-11 DIAGNOSIS — C569 Malignant neoplasm of unspecified ovary: Secondary | ICD-10-CM | POA: Diagnosis not present

## 2020-10-12 DIAGNOSIS — C569 Malignant neoplasm of unspecified ovary: Secondary | ICD-10-CM | POA: Diagnosis not present

## 2020-10-26 DIAGNOSIS — Z1502 Genetic susceptibility to malignant neoplasm of ovary: Secondary | ICD-10-CM | POA: Diagnosis not present

## 2020-10-26 DIAGNOSIS — Z79810 Long term (current) use of selective estrogen receptor modulators (SERMs): Secondary | ICD-10-CM | POA: Diagnosis not present

## 2020-10-26 DIAGNOSIS — Z79811 Long term (current) use of aromatase inhibitors: Secondary | ICD-10-CM | POA: Diagnosis not present

## 2020-10-26 DIAGNOSIS — Z90710 Acquired absence of both cervix and uterus: Secondary | ICD-10-CM | POA: Diagnosis not present

## 2020-10-26 DIAGNOSIS — T451X5A Adverse effect of antineoplastic and immunosuppressive drugs, initial encounter: Secondary | ICD-10-CM | POA: Diagnosis not present

## 2020-10-26 DIAGNOSIS — J45909 Unspecified asthma, uncomplicated: Secondary | ICD-10-CM | POA: Diagnosis not present

## 2020-10-26 DIAGNOSIS — C786 Secondary malignant neoplasm of retroperitoneum and peritoneum: Secondary | ICD-10-CM | POA: Diagnosis not present

## 2020-10-26 DIAGNOSIS — M7989 Other specified soft tissue disorders: Secondary | ICD-10-CM | POA: Diagnosis not present

## 2020-10-26 DIAGNOSIS — R911 Solitary pulmonary nodule: Secondary | ICD-10-CM | POA: Diagnosis not present

## 2020-10-26 DIAGNOSIS — Z5111 Encounter for antineoplastic chemotherapy: Secondary | ICD-10-CM | POA: Diagnosis not present

## 2020-10-26 DIAGNOSIS — D6481 Anemia due to antineoplastic chemotherapy: Secondary | ICD-10-CM | POA: Diagnosis not present

## 2020-10-26 DIAGNOSIS — C562 Malignant neoplasm of left ovary: Secondary | ICD-10-CM | POA: Diagnosis not present

## 2020-10-26 DIAGNOSIS — C775 Secondary and unspecified malignant neoplasm of intrapelvic lymph nodes: Secondary | ICD-10-CM | POA: Diagnosis not present

## 2020-10-26 DIAGNOSIS — I10 Essential (primary) hypertension: Secondary | ICD-10-CM | POA: Diagnosis not present

## 2020-10-26 DIAGNOSIS — C787 Secondary malignant neoplasm of liver and intrahepatic bile duct: Secondary | ICD-10-CM | POA: Diagnosis not present

## 2020-10-26 DIAGNOSIS — Z9081 Acquired absence of spleen: Secondary | ICD-10-CM | POA: Diagnosis not present

## 2020-10-26 DIAGNOSIS — M810 Age-related osteoporosis without current pathological fracture: Secondary | ICD-10-CM | POA: Diagnosis not present

## 2020-10-26 DIAGNOSIS — Z5112 Encounter for antineoplastic immunotherapy: Secondary | ICD-10-CM | POA: Diagnosis not present

## 2020-10-26 DIAGNOSIS — Z1509 Genetic susceptibility to other malignant neoplasm: Secondary | ICD-10-CM | POA: Diagnosis not present

## 2020-10-26 DIAGNOSIS — C561 Malignant neoplasm of right ovary: Secondary | ICD-10-CM | POA: Diagnosis not present

## 2020-10-26 DIAGNOSIS — Z634 Disappearance and death of family member: Secondary | ICD-10-CM | POA: Diagnosis not present

## 2020-10-26 DIAGNOSIS — Z90722 Acquired absence of ovaries, bilateral: Secondary | ICD-10-CM | POA: Diagnosis not present

## 2020-10-26 DIAGNOSIS — C7951 Secondary malignant neoplasm of bone: Secondary | ICD-10-CM | POA: Diagnosis not present

## 2020-10-26 DIAGNOSIS — Z79899 Other long term (current) drug therapy: Secondary | ICD-10-CM | POA: Diagnosis not present

## 2020-10-26 DIAGNOSIS — C569 Malignant neoplasm of unspecified ovary: Secondary | ICD-10-CM | POA: Diagnosis not present

## 2020-11-18 DIAGNOSIS — C569 Malignant neoplasm of unspecified ovary: Secondary | ICD-10-CM | POA: Diagnosis not present

## 2020-11-19 DIAGNOSIS — C569 Malignant neoplasm of unspecified ovary: Secondary | ICD-10-CM | POA: Diagnosis not present

## 2020-11-19 DIAGNOSIS — K668 Other specified disorders of peritoneum: Secondary | ICD-10-CM | POA: Diagnosis not present

## 2020-11-19 DIAGNOSIS — R7989 Other specified abnormal findings of blood chemistry: Secondary | ICD-10-CM | POA: Diagnosis not present

## 2020-11-19 DIAGNOSIS — K8689 Other specified diseases of pancreas: Secondary | ICD-10-CM | POA: Diagnosis not present

## 2020-11-19 DIAGNOSIS — C775 Secondary and unspecified malignant neoplasm of intrapelvic lymph nodes: Secondary | ICD-10-CM | POA: Diagnosis not present

## 2020-11-19 DIAGNOSIS — K838 Other specified diseases of biliary tract: Secondary | ICD-10-CM | POA: Diagnosis not present

## 2020-11-23 DIAGNOSIS — G893 Neoplasm related pain (acute) (chronic): Secondary | ICD-10-CM | POA: Diagnosis not present

## 2020-11-23 DIAGNOSIS — R590 Localized enlarged lymph nodes: Secondary | ICD-10-CM | POA: Diagnosis not present

## 2020-11-23 DIAGNOSIS — Z7189 Other specified counseling: Secondary | ICD-10-CM | POA: Diagnosis not present

## 2020-11-23 DIAGNOSIS — J45909 Unspecified asthma, uncomplicated: Secondary | ICD-10-CM | POA: Diagnosis not present

## 2020-11-23 DIAGNOSIS — Z9221 Personal history of antineoplastic chemotherapy: Secondary | ICD-10-CM | POA: Diagnosis not present

## 2020-11-23 DIAGNOSIS — C569 Malignant neoplasm of unspecified ovary: Secondary | ICD-10-CM | POA: Diagnosis not present

## 2020-11-23 DIAGNOSIS — F502 Bulimia nervosa: Secondary | ICD-10-CM | POA: Diagnosis not present

## 2020-11-23 DIAGNOSIS — Z923 Personal history of irradiation: Secondary | ICD-10-CM | POA: Diagnosis not present

## 2020-11-23 DIAGNOSIS — M7989 Other specified soft tissue disorders: Secondary | ICD-10-CM | POA: Diagnosis not present

## 2020-11-23 DIAGNOSIS — R112 Nausea with vomiting, unspecified: Secondary | ICD-10-CM | POA: Diagnosis not present

## 2020-11-23 DIAGNOSIS — I10 Essential (primary) hypertension: Secondary | ICD-10-CM | POA: Diagnosis not present

## 2020-11-23 DIAGNOSIS — C775 Secondary and unspecified malignant neoplasm of intrapelvic lymph nodes: Secondary | ICD-10-CM | POA: Diagnosis not present

## 2020-11-23 DIAGNOSIS — C561 Malignant neoplasm of right ovary: Secondary | ICD-10-CM | POA: Diagnosis not present

## 2020-11-23 DIAGNOSIS — C562 Malignant neoplasm of left ovary: Secondary | ICD-10-CM | POA: Diagnosis not present

## 2020-11-23 DIAGNOSIS — Z79899 Other long term (current) drug therapy: Secondary | ICD-10-CM | POA: Diagnosis not present

## 2020-11-23 DIAGNOSIS — M810 Age-related osteoporosis without current pathological fracture: Secondary | ICD-10-CM | POA: Diagnosis not present

## 2020-11-24 DIAGNOSIS — C775 Secondary and unspecified malignant neoplasm of intrapelvic lymph nodes: Secondary | ICD-10-CM | POA: Diagnosis not present

## 2020-11-24 DIAGNOSIS — E034 Atrophy of thyroid (acquired): Secondary | ICD-10-CM | POA: Diagnosis not present

## 2020-11-24 DIAGNOSIS — G620 Drug-induced polyneuropathy: Secondary | ICD-10-CM | POA: Diagnosis not present

## 2020-11-24 DIAGNOSIS — I10 Essential (primary) hypertension: Secondary | ICD-10-CM | POA: Diagnosis not present

## 2020-11-24 DIAGNOSIS — J4520 Mild intermittent asthma, uncomplicated: Secondary | ICD-10-CM | POA: Diagnosis not present

## 2020-11-24 DIAGNOSIS — Z0000 Encounter for general adult medical examination without abnormal findings: Secondary | ICD-10-CM | POA: Diagnosis not present

## 2020-11-24 DIAGNOSIS — C787 Secondary malignant neoplasm of liver and intrahepatic bile duct: Secondary | ICD-10-CM | POA: Diagnosis not present

## 2020-11-24 DIAGNOSIS — C569 Malignant neoplasm of unspecified ovary: Secondary | ICD-10-CM | POA: Diagnosis not present

## 2020-11-24 DIAGNOSIS — E612 Magnesium deficiency: Secondary | ICD-10-CM | POA: Diagnosis not present

## 2020-12-07 DIAGNOSIS — C569 Malignant neoplasm of unspecified ovary: Secondary | ICD-10-CM | POA: Diagnosis not present

## 2021-01-07 DEATH — deceased
